# Patient Record
Sex: Female | Born: 1975 | Race: White | Hispanic: No | Marital: Married | Smoking: Never smoker
Health system: Southern US, Community
[De-identification: ages and names within clinical notes are randomized; demographics above are authoritative.]

## PROBLEM LIST (undated history)

## (undated) DIAGNOSIS — G43909 Migraine, unspecified, not intractable, without status migrainosus: Secondary | ICD-10-CM

## (undated) DIAGNOSIS — D649 Anemia, unspecified: Secondary | ICD-10-CM

## (undated) DIAGNOSIS — F419 Anxiety disorder, unspecified: Secondary | ICD-10-CM

## (undated) DIAGNOSIS — F329 Major depressive disorder, single episode, unspecified: Secondary | ICD-10-CM

## (undated) DIAGNOSIS — E785 Hyperlipidemia, unspecified: Secondary | ICD-10-CM

## (undated) DIAGNOSIS — F32A Depression, unspecified: Secondary | ICD-10-CM

## (undated) HISTORY — DX: Migraine, unspecified, not intractable, without status migrainosus: G43.909

## (undated) HISTORY — DX: Major depressive disorder, single episode, unspecified: F32.9

## (undated) HISTORY — DX: Anxiety disorder, unspecified: F41.9

## (undated) HISTORY — DX: Hyperlipidemia, unspecified: E78.5

## (undated) HISTORY — DX: Depression, unspecified: F32.A

## (undated) HISTORY — DX: Anemia, unspecified: D64.9

---

## 1998-06-19 HISTORY — PX: HEMANGIOMA EXCISION: SHX1734

## 1998-06-19 HISTORY — PX: BREAST SURGERY: SHX581

## 2000-12-25 ENCOUNTER — Other Ambulatory Visit: Admission: RE | Admit: 2000-12-25 | Discharge: 2000-12-25 | Payer: Self-pay | Admitting: *Deleted

## 2001-03-11 ENCOUNTER — Inpatient Hospital Stay (HOSPITAL_COMMUNITY): Admission: AD | Admit: 2001-03-11 | Discharge: 2001-03-11 | Payer: Self-pay | Admitting: General Surgery

## 2001-07-09 ENCOUNTER — Inpatient Hospital Stay (HOSPITAL_COMMUNITY): Admission: AD | Admit: 2001-07-09 | Discharge: 2001-07-13 | Payer: Self-pay | Admitting: *Deleted

## 2001-07-14 ENCOUNTER — Encounter: Admission: RE | Admit: 2001-07-14 | Discharge: 2001-08-13 | Payer: Self-pay | Admitting: *Deleted

## 2001-08-14 ENCOUNTER — Encounter: Admission: RE | Admit: 2001-08-14 | Discharge: 2001-09-13 | Payer: Self-pay | Admitting: *Deleted

## 2001-08-21 ENCOUNTER — Other Ambulatory Visit: Admission: RE | Admit: 2001-08-21 | Discharge: 2001-08-21 | Payer: Self-pay | Admitting: *Deleted

## 2007-05-01 ENCOUNTER — Emergency Department (HOSPITAL_COMMUNITY): Admission: EM | Admit: 2007-05-01 | Discharge: 2007-05-01 | Payer: Self-pay | Admitting: Emergency Medicine

## 2008-06-19 HISTORY — PX: ANKLE FRACTURE SURGERY: SHX122

## 2008-11-14 ENCOUNTER — Emergency Department (HOSPITAL_BASED_OUTPATIENT_CLINIC_OR_DEPARTMENT_OTHER): Admission: EM | Admit: 2008-11-14 | Discharge: 2008-11-14 | Payer: Self-pay | Admitting: Emergency Medicine

## 2008-11-14 ENCOUNTER — Ambulatory Visit: Payer: Self-pay | Admitting: Diagnostic Radiology

## 2008-11-25 ENCOUNTER — Ambulatory Visit (HOSPITAL_COMMUNITY): Admission: RE | Admit: 2008-11-25 | Discharge: 2008-11-27 | Payer: Self-pay | Admitting: Specialist

## 2009-01-06 ENCOUNTER — Encounter: Admission: RE | Admit: 2009-01-06 | Discharge: 2009-04-01 | Payer: Self-pay | Admitting: Specialist

## 2009-03-03 ENCOUNTER — Encounter: Admission: RE | Admit: 2009-03-03 | Discharge: 2009-03-03 | Payer: Self-pay | Admitting: Obstetrics and Gynecology

## 2009-05-24 ENCOUNTER — Ambulatory Visit: Payer: Self-pay | Admitting: Occupational Medicine

## 2009-05-24 DIAGNOSIS — F329 Major depressive disorder, single episode, unspecified: Secondary | ICD-10-CM

## 2009-07-29 ENCOUNTER — Ambulatory Visit: Payer: Self-pay | Admitting: Family Medicine

## 2010-06-19 HISTORY — PX: GYNECOLOGIC CRYOSURGERY: SHX857

## 2010-07-03 IMAGING — CR DG ANKLE PORT 2V*L*
2 series · 2 of 2 positions shown · non-contrast
Comparison: 11/25/2008

CLINICAL DATA: Distal fibular fracture, postop.

PORTABLE LEFT ANKLE - 2 VIEW

[view not recorded (1 of 2)]
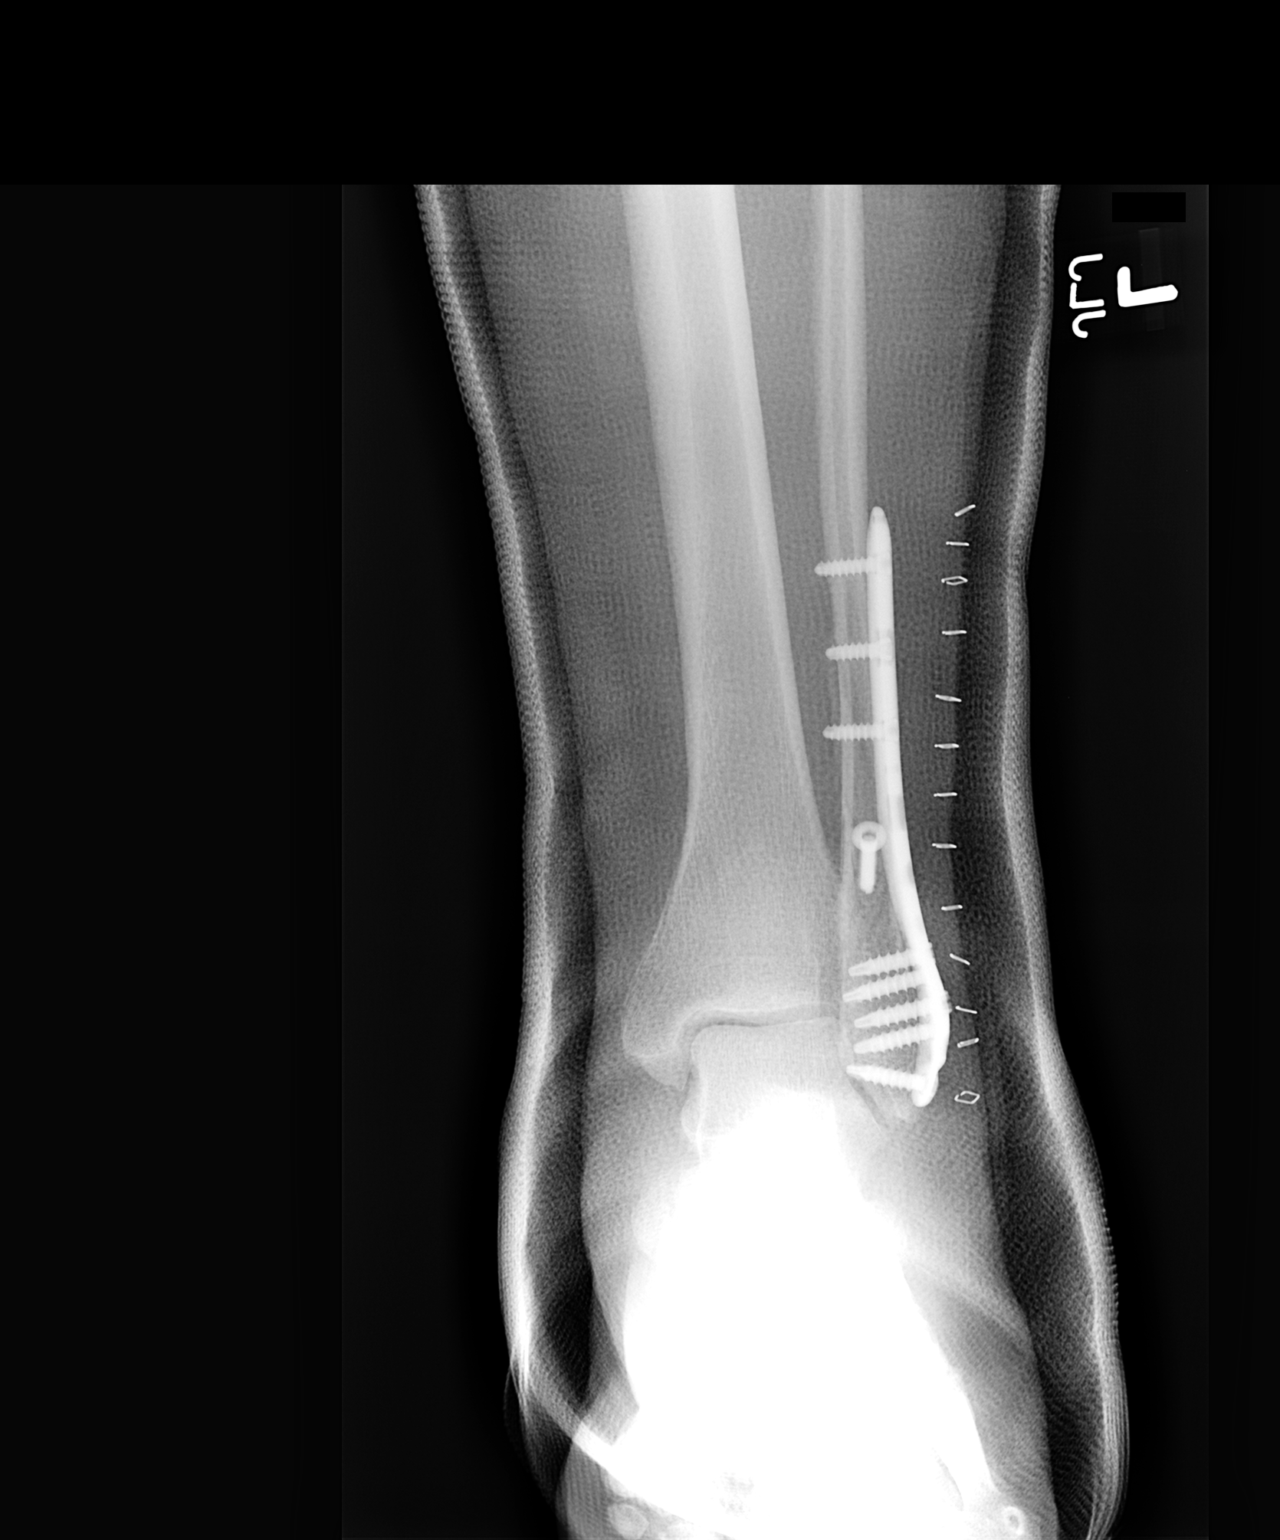

[view not recorded (2 of 2)]
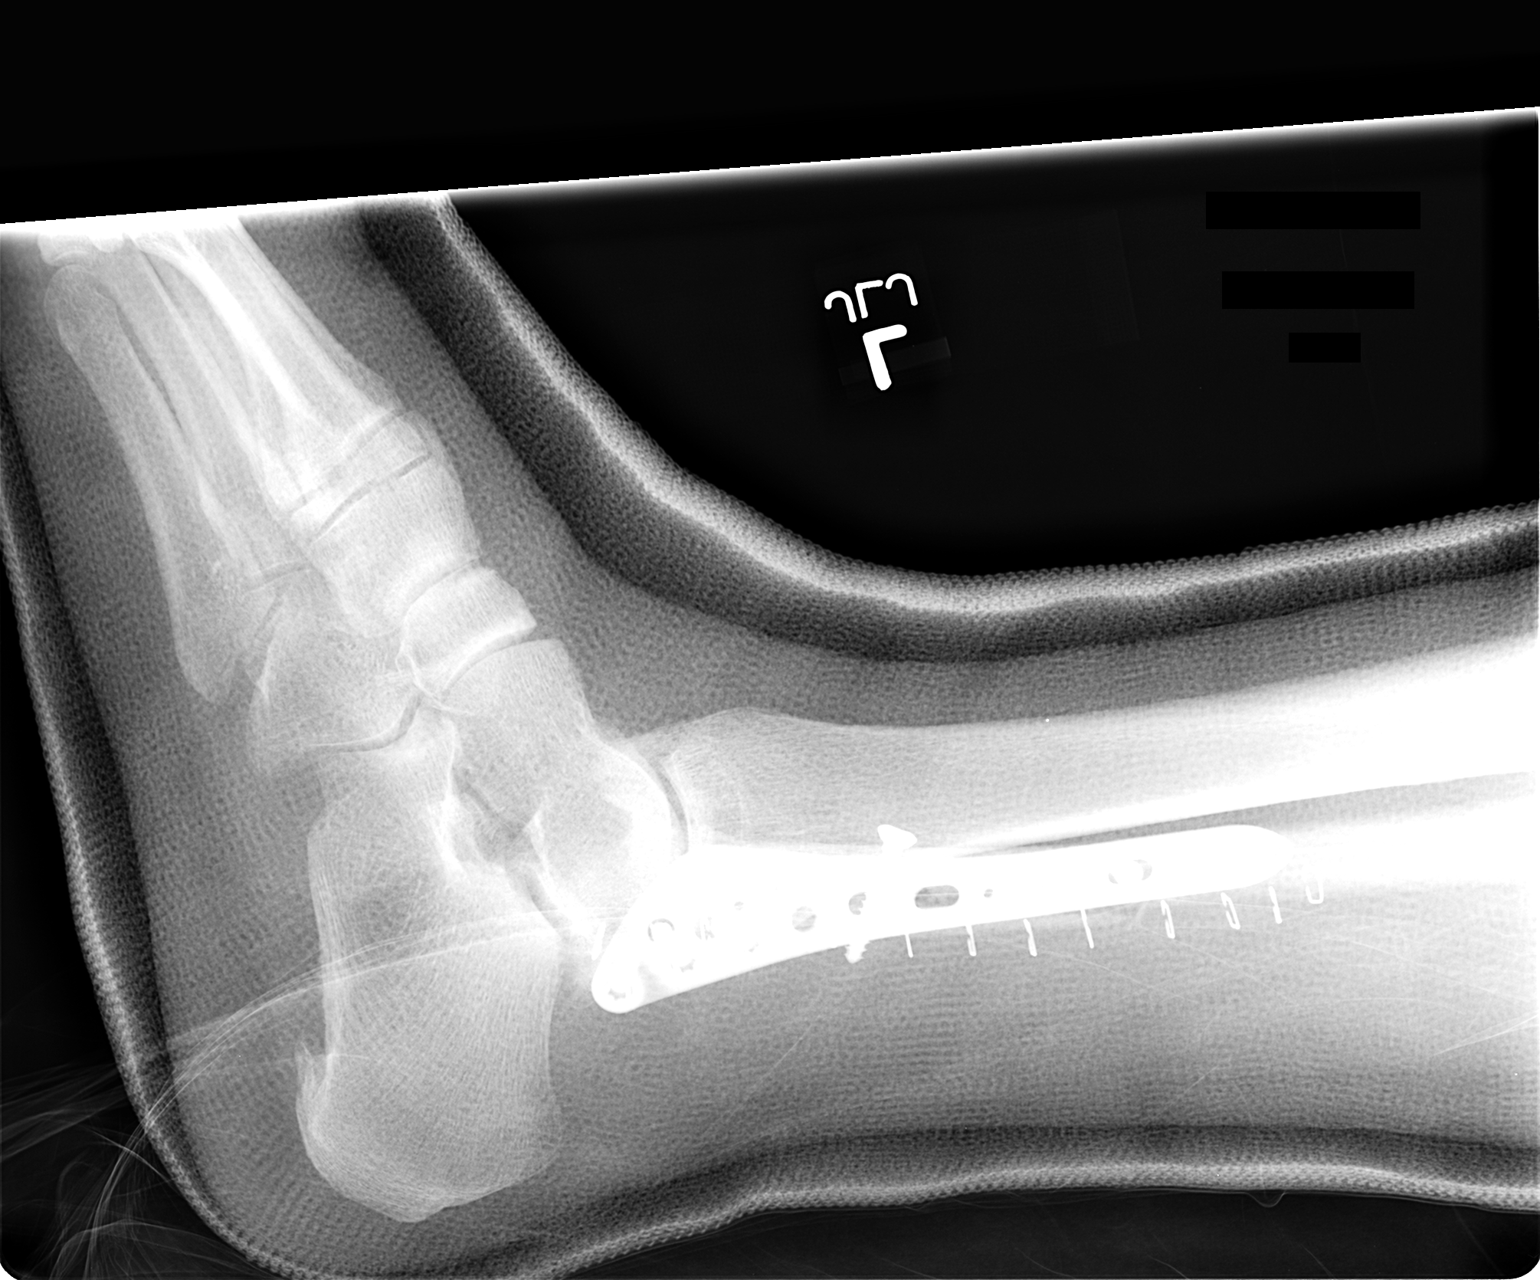

[2 of 2 positions shown; findings below may reference images not displayed]

FINDINGS: Plate screw fixation of the distal fibular fracture
noted.  There is anatomic alignment.  No tibial abnormality.
IMPRESSION: ORIF left fibular fracture with anatomic alignment.  No
complicating features.

## 2010-07-19 NOTE — Letter (Signed)
Summary: Out of Work  [facility]  [address]   [place], [place] [address]   Phone: [phone]  Fax: [phone]    July 29, 2009   Employee:  Debbie Schroeder    To Whom It May Concern:   For Medical reasons, please excuse the above named employee from work for the following dates:  Start:   29 Jul 2009  End:   01 Aug 2009 IF NO FEVER AND SYMPTOMS RESOLVING  If you need additional information, please feel free to contact our office.         Sincerely,    Marvis Moeller DO

## 2010-07-19 NOTE — Assessment & Plan Note (Signed)
Summary: SORE THROAT/WB   Vital Signs:  Patient Profile:   35 Years Old Female CC:      Chills, sore throat, body aches Height:     62.25 inches Weight:      167 pounds O2 Sat:      99 % O2 treatment:    Room Air Temp:     98.8 degrees F oral Pulse rate:   106 / minute Pulse rhythm:   regular Resp:     14 per minute BP sitting:   126 / 78  (right arm) Cuff size:   regular  Pt. in pain?   no  Vitals Entered By: Lita Mains, RN                   Updated Prior Medication List: ALPRAZOLAM 0.25 MG TABS (ALPRAZOLAM) 1-2 tabs as needed LEXAPRO 10 MG TABS (ESCITALOPRAM OXALATE) 1 tab by mouth once daily SUDAFED 30 MG TABS (PSEUDOEPHEDRINE HCL) as needed, one taken this AM  Current Allergies (reviewed today): ! * LATEX   History of Present Illness Chief Complaint: Chills, sore throat, body aches History of Present Illness: ONESET 2 DAYS AGO WITH BODYACHES. DEVELOPED SEVER SORE THROAT YESTERDAY. FEVER LOW GRADE. TAKING MOTRIN. NO RUNNY NOSE , NO COUGH. NO VOMITING OR DIARRHEA. HX OF STREP IN PAST.   REVIEW OF SYSTEMS Constitutional Symptoms       Complains of chills and fatigue.     Denies fever, night sweats, weight loss, and weight gain.  Eyes       Denies change in vision, eye pain, eye discharge, glasses, contact lenses, and eye surgery. Ear/Nose/Throat/Mouth       Complains of sore throat.      Denies hearing loss/aids, change in hearing, ear pain, ear discharge, dizziness, frequent runny nose, frequent nose bleeds, sinus problems, hoarseness, and tooth pain or bleeding.  Respiratory       Denies dry cough, productive cough, wheezing, shortness of breath, asthma, bronchitis, and emphysema/COPD.  Cardiovascular       Denies murmurs, chest pain, and tires easily with exhertion.    Gastrointestinal       Denies stomach pain, nausea/vomiting, diarrhea, constipation, blood in bowel movements, and indigestion. Genitourniary       Denies painful urination, kidney stones,  and loss of urinary control. Neurological       Complains of headaches.      Denies paralysis, seizures, and fainting/blackouts. Musculoskeletal       Complains of muscle pain.      Denies joint pain, joint stiffness, decreased range of motion, redness, swelling, muscle weakness, and gout.      Comments: body aches Skin       Denies bruising, unusual mles/lumps or sores, and hair/skin or nail changes.  Psych       Denies mood changes, temper/anger issues, anxiety/stress, speech problems, depression, and sleep problems.  Past History:  Past Medical History: Depression  Past Surgical History: Reviewed history from 05/24/2009 and no changes required. Caesarean section Ankle Surgery  Social History: Reviewed history from 05/24/2009 and no changes required. Married Never Smoked Alcohol use-yes 1 drink weekly Drug use-no Regular exercise-yes Assessment New Problems: PHARYNGITIS, ACUTE (ICD-462.0)   Plan New Medications/Changes: TYLENOL #3 WITH CODINE 1-2 by mouth Q 6 HRS as needed  #12 x 0, 07/29/2009, Kimberly Lykins DO AMOXICILLIN 875 MG TABS (AMOXICILLIN) 1 by mouth TID  ##30 x 0, 07/29/2009, Marvis Moeller DO  New Orders: Est. Patient Level III [phone]  Prescriptions: TYLENOL #3 WITH CODINE 1-2 by mouth Q 6 HRS as needed  #12 x 0   Entered and Authorized by:   Marvis Moeller DO   Signed by:   Marvis Moeller DO on 07/29/2009   Method used:   Print then Give to Patient   RxID:   [id number] AMOXICILLIN 875 MG TABS (AMOXICILLIN) 1 by mouth TID  ##30 x 0   Entered and Authorized by:   Marvis Moeller DO   Signed by:   Marvis Moeller DO on 07/29/2009   Method used:   Print then Give to Patient   RxID:   [id number]   Patient Instructions: 1)  TYLENOL OR MOTRIN AS NEEDED. AVOID CAFFEINE AND MILK PRODUCTS. THROAT SPRAY OF CHOICE. FOLLOW UP WITH YOUR PCP OR RETURN IF SYMPTOMS PESIST OR WORSEN.   Medication Administration  Injection # 1:    Medication:  Bicillin CR 1.2 million units Injection    Diagnosis: PHARYNGITIS, ACUTE (ICD-462.0)    Route: IM    Site: LUOQ gluteus    Exp Date: 02/17/2012    Lot #: [id number]    Mfr: Brooke Dare    Patient tolerated injection without complications    Given by: Lita Mains RN (July 29, 2009 1:19 PM)  Orders Added: 1)  Est. Patient Level III [09323]   Received call from Mike/Pharmacist at [facility] @ [phone] requesting verification of directions of antibitic Amoxicillin 875 mg 1 tab by mouth three times a day. Pharmacist stated that usual instructions has pt taking med two times a day due to the high dosage. Reviewed with Dr. Cathren Harsh, advsd pharmacist to change instruction from three times a day to two times a day . Areta Haber CMA  July 30, 2009 6:49 PM

## 2010-09-26 LAB — COMPREHENSIVE METABOLIC PANEL WITH GFR
ALT: 27 U/L (ref 0–35)
AST: 24 U/L (ref 0–37)
Albumin: 3.9 g/dL (ref 3.5–5.2)
Alkaline Phosphatase: 56 U/L (ref 39–117)
BUN: 17 mg/dL (ref 6–23)
CO2: 25 meq/L (ref 19–32)
Calcium: 9.8 mg/dL (ref 8.4–10.5)
Chloride: 103 meq/L (ref 96–112)
Creatinine, Ser: 0.83 mg/dL (ref 0.4–1.2)
GFR calc non Af Amer: >60 mL/min
Glucose, Bld: 97 mg/dL (ref 70–99)
Potassium: 3.9 meq/L (ref 3.5–5.1)
Sodium: 138 meq/L (ref 135–145)
Total Bilirubin: 0.7 mg/dL (ref 0.3–1.2)
Total Protein: 7.5 g/dL (ref 6.0–8.3)

## 2010-09-26 LAB — CBC
Hemoglobin: 12.7 g/dL (ref 12.0–15.0)
MCHC: 34.8 g/dL (ref 30.0–36.0)
MCV: 90.7 fL (ref 78.0–100.0)
Platelets: 237 10*3/uL (ref 150–400)
Platelets: 330 10*3/uL (ref 150–400)
RDW: 11.9 % (ref 11.5–15.5)
RDW: 12.2 % (ref 11.5–15.5)
WBC: 8.9 10*3/uL (ref 4.0–10.5)

## 2010-09-26 LAB — PLATELET FUNCTION ASSAY
Collagen / ADP: 86 s (ref 0–113)
Collagen / Epinephrine: 240 s (ref 0–180)

## 2010-10-09 IMAGING — MG MM DIGITAL DIAGNOSTIC BILAT CAD
6 series · 6 of 6 positions shown · non-contrast
Comparison: None, baseline

CLINICAL DATA: Palpable abnormality left breast.

DIGITAL DIAGNOSTIC  BILATERAL  MAMMOGRAM  CAD AND LEFT BREAST
ULTRASOUND:

[R CC (1 of 2)]
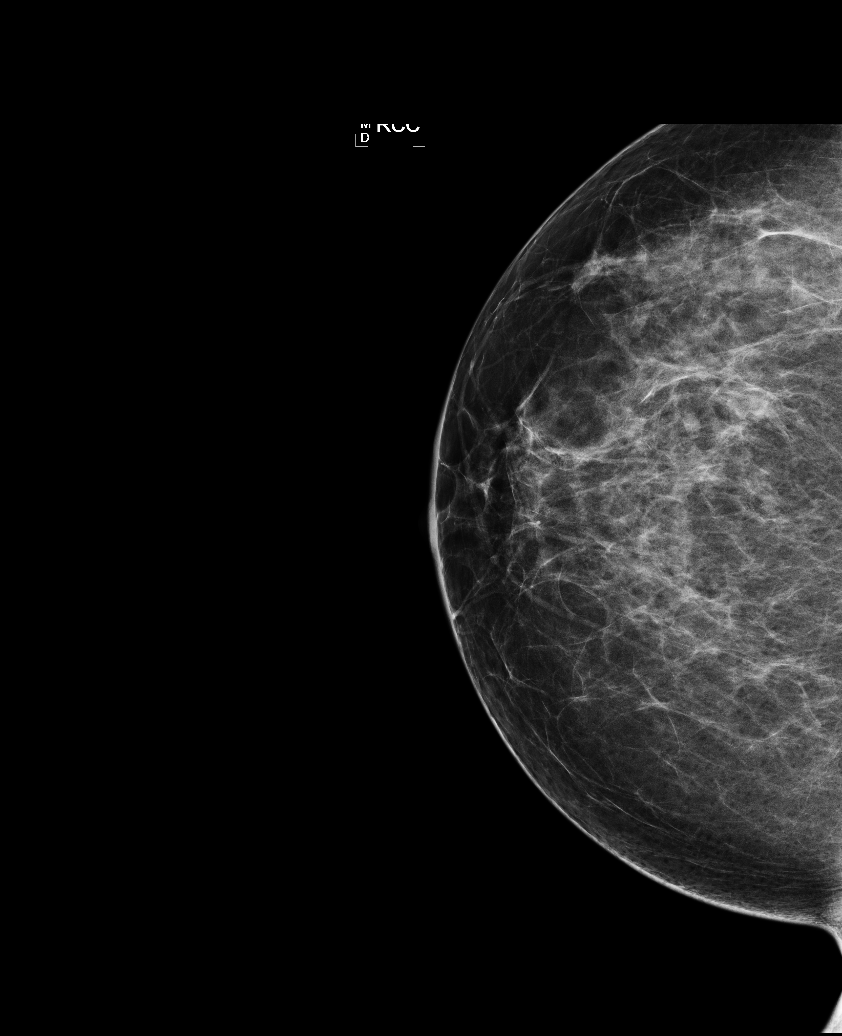

[L CC]
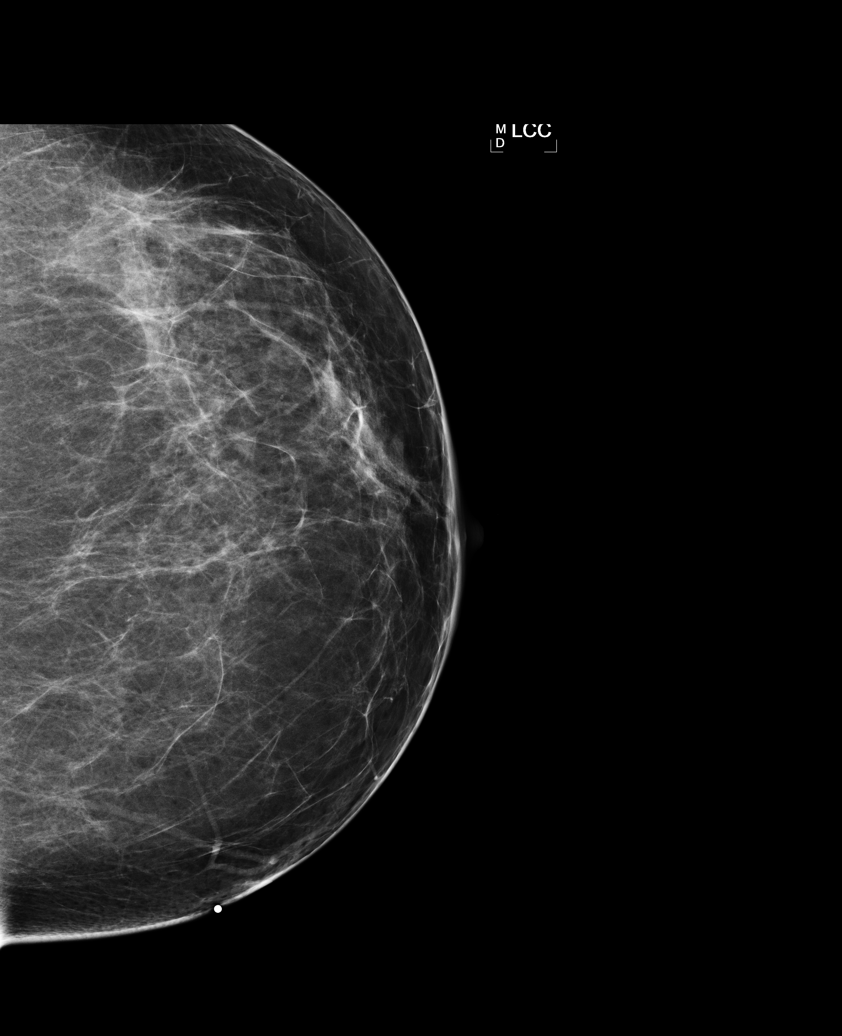

[L MLO]
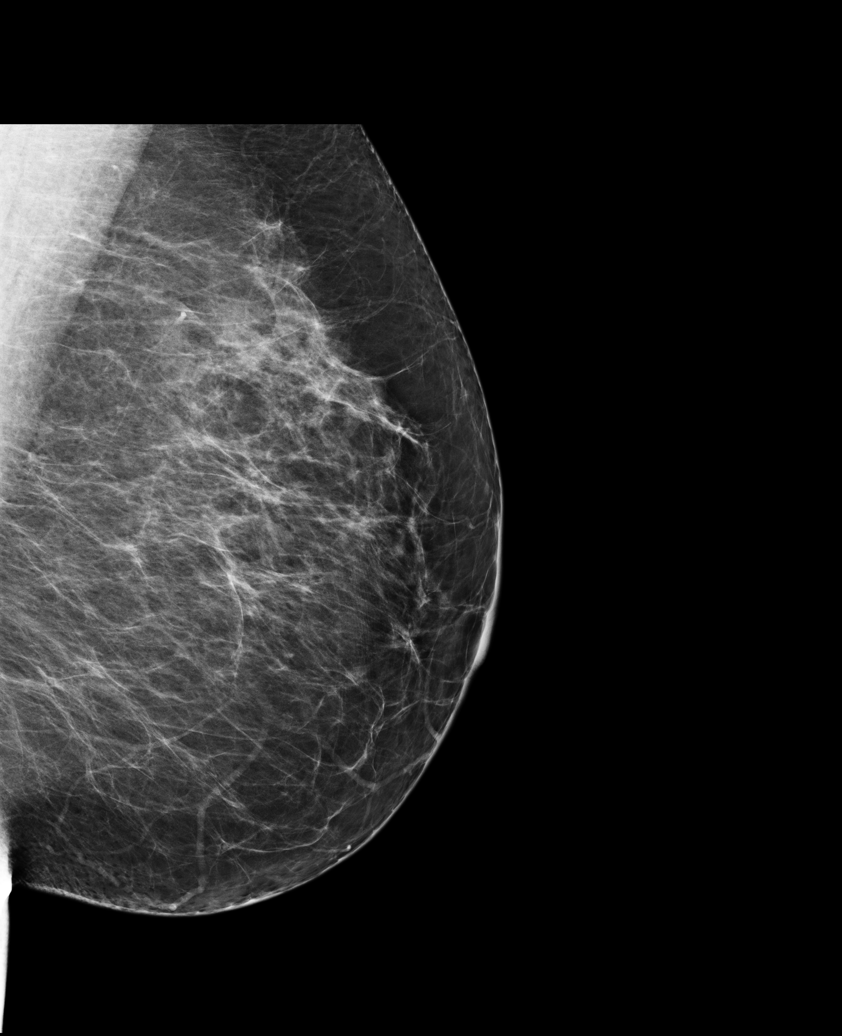

[R MLO]
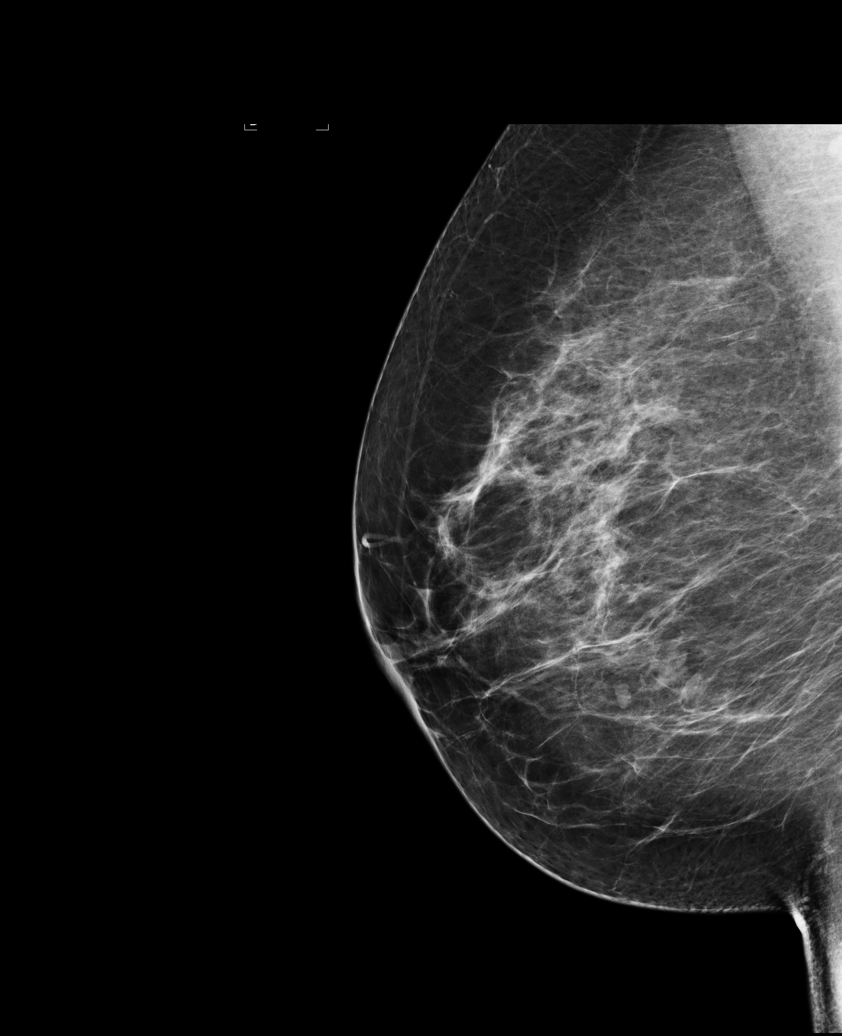

[L TAN]
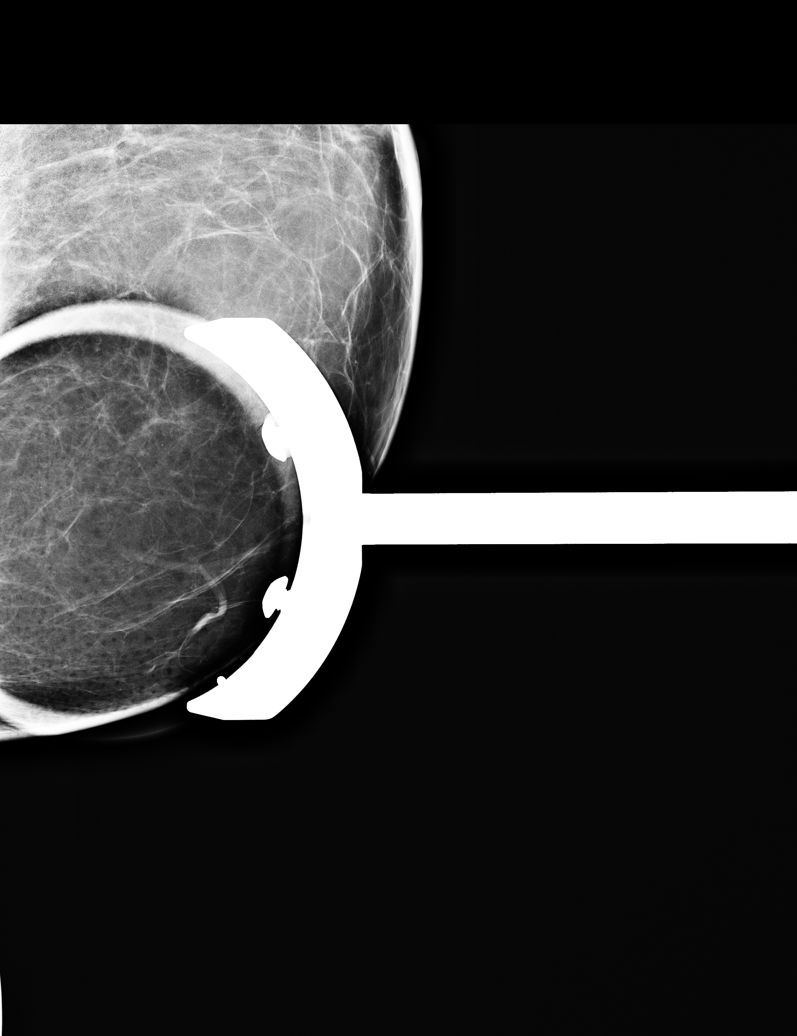

[R CC (2 of 2)]
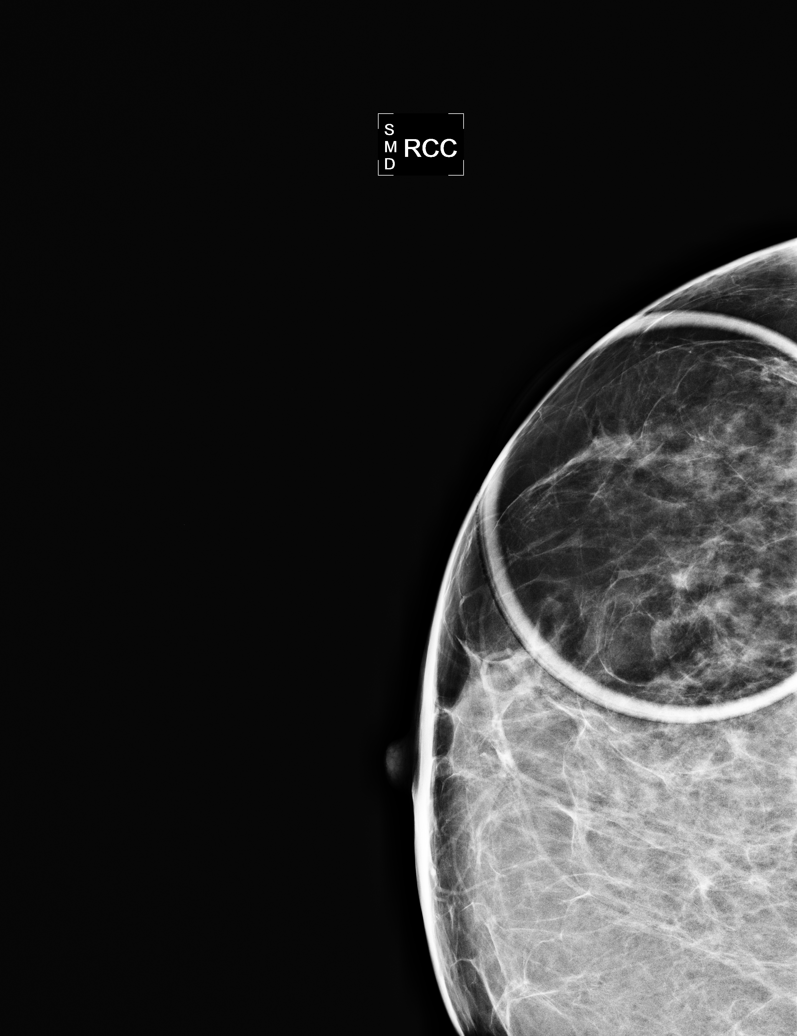

[6 of 6 positions shown; findings below may reference images not displayed]

FINDINGS: There are scattered fibroglandular densities. No
suspicious mass, distortion, or microcalcifications are identified
to suggest presence of malignancy. Spot tangential view in the area
of concern in the medial portion of the left breast is
unremarkable.
Mammographic images were processed with CAD.

On physical exam, I do palpate soft nodule in the 8 o'clock
position of the left breast 10 cm from the nipple.

Ultrasound is performed, showing normal-appearing fibrofatty tissue
in the area of concern.  There is suggestion of a capsule
surrounding fibrofatty tissue to indicate possible lipoma
accounting for the palpable abnormality.  Alternatively, the
findings could be related to fat lobule or area of fat necrosis. No
mass, distortion, or acoustic shadowing is demonstrated with
ultrasound.
IMPRESSION: No mammographic or ultrasound evidence for malignancy.  The
palpable abnormality appears to represent a probable lipoma.
Screening mammogram is recommended in one year.

BI-RADS CATEGORY 2:  Benign finding(s).

## 2010-11-01 NOTE — Op Note (Signed)
NAMENHUNG, DANKO                ACCOUNT NO.:  [id number]   MEDICAL RECORD NO.:  [id number]          PATIENT TYPE:  OIB   LOCATION:  1609                         FACILITY:  [facility]   PHYSICIAN:  Jene Every, M.D.    DATE OF BIRTH:  June 13, 1976   DATE OF PROCEDURE:  11/25/2008  DATE OF DISCHARGE:                               OPERATIVE REPORT   PREOPERATIVE DIAGNOSIS:  Bimalleolar ankle fracture.   POSTOPERATIVE DIAGNOSIS:  Bimalleolar ankle fracture.   PROCEDURE PERFORMED:  1. Open reduction/internal fixation of bimalleolar ankle fracture.  2. Stress radiographs under anesthesia.   ANESTHESIA:  General.   SURGEON:  Jene Every, M.D.   ASSISTANT:  Roma Schanz, P.A.   BRIEF HISTORY:  A 35 year old sustained an ankle fracture, comminuted.  Initially felt just to be a comminuted fibula with possible syndesmotic  widening.  On subsequent further evaluation revealed a posterior  malleolar fracture as well as a proximal 25% of the surface with slight  stepoff.  She was indicated for open reduction/internal fixation,  possible syndesmosis screw.  The risks and benefits have been discussed,  including bleeding, infection, damage to vascular structures, suboptimal  range of motion, __________, DVT, PE, and extended complications, etc.  A health care worker exposed her to MRSA, and she was given vancomycin  preoperatively.   TECHNIQUE:  With the patient in the supine position after the induction  of adequate general anesthesia and 1 gm of vancomycin, the left lower  extremity was prepped and draped an exsanguinated in the usual sterile  fashion.  A thigh tourniquet inflated at 300 mmHg.  A midline incision  was made over the fibula.  Subcutaneous tissue was dissected.  Electrocautery was utilized to achieve hemostasis.  Full-thickness flaps  were developed.  Care was taken to preserve the peroneal nerve and its  branches.  We identified the fracture site.  There was  comminution  noted.  We curetted the fracture hematoma, reduced, and held the  fracture with a tenaculum.  We put a lag screw with an overdrill 3.5, a  full drill of a 2.5, and inserted appropriate length screw.  Suboptimal  purchase was obtained secondary to the comminution.  Therefore, decided  to use a Synthes precontoured titanium plate to capture the distal  fragment optimally.  This has been selected with a small __________  applied to the fibula, clamped and held proximally with fully-threaded  cortical screws after the appropriate drilling depth gave measurement  insertion of the screws and additionally multiple locking screws  utilizing the drill guides.  Multiple 16 mm lag locking screws were  placed and then torqued to the appropriate level.  They attained  excellent purchase distally and proximally.  Under the AP and lateral  plane, there is no widening syndesmosis, anatomic reduction of the  fracture.  There was no attempt with manipulation with stress  radiographs.  There is no motion at the syndesmosis and the fracture  site.  The fracture was reduced, as was the medial clear space.  No need  to open up medially.  With the leg in the  neutral as well as the plantar-  flexed position of 30 degrees, the posterior malleolar fragment was  reduced.  This was radiographed outside of the cast.  The wound was  copiously irrigated.  Next, I closed the periosteum and subcu with 0 and  2-0 Vicryl simple sutures.  The skin was reapproximated with staples.  The wound was dressed sterilely.  The tourniquet was deflated, and there  was adequate vascularization of the lower extremity appreciated.  Patient was placed in the neutral cast, slightly supinated fiberglass  cast, well padded, cold water, appropriate drain.   Next, the patient was extubated without difficulty and transported to  the recovery room in satisfactory condition.  Patient tolerated the  procedure well with no  complications.      Jene Every, M.D.  Electronically Signed     JB/MEDQ  D:  11/25/2008  T:  11/25/2008  Job:  [id number]

## 2010-11-04 NOTE — Op Note (Signed)
[facility] of [facility]  Patient:    Debbie Schroeder, Debbie Schroeder Visit Number: [id number] MRN: [id number]          Service Type: OBS Location: 910A 9135 01 Attending Physician:  Ermalene Searing Dictated by:   Marina Gravel, M.D. Proc. Date: 07/10/01 Admit Date:  07/09/2001                             Operative Report  PREOPERATIVE DIAGNOSIS:       Failure to descend.  POSTOPERATIVE DIAGNOSIS:      Failure to descend.  OPERATION:                    Primary low transverse cesarean section.  SURGEON:                      Marina Gravel, M.D.  ASSISTANT:                    Nurse.  ANESTHESIA:                   Epidural.  FINDINGS:                     Viable female at 8 pounds 15 ounces.  Apgars 9 and 9.  Normal uterus, tubes, and ovaries.  ESTIMATED BLOOD LOSS:         750 cc.  COMPLICATIONS:                None.  INDICATIONS:                  Patient pushed for over two hours and did not descend beyond +2-+3 station.  She had a moderate amount of caput.  Options were discussed including continued pushing versus operative vaginal delivery versus primary cesarean section.  After discussing the risks of benefits of each patient desired to proceed with primary cesarean section.  DESCRIPTION OF PROCEDURE:     Patient was taken to the operating room with epidural anesthesia in place.  She was prepped and draped in the standard fashion.  The bladder was already being emptied with the Foley catheter. Pfannenstiel incision was made with the knife and carried sharply to the underlying fascia.  The fascia was divided in the midline and extended laterally with Mayo scissors.  Kocher clamps used to elevate the superior aspect of the incision.  The underlying rectus muscles were dissected off sharply.  Repeated inferiorly in a similar fashion.  The midline of the rectus muscles was identified at the superior aspect and the peritoneum entered sharply.  Extended inferiorly  sharply.  Bladder blade was inserted and vesicouterine peritoneum identified and elevated and bladder flap created with sharp and blunt technique.  The uterine incision was subsequently made in a low transverse fashion.  This was extended laterally with bandage scissors.  A nuchal cord x1 was noted at entry into the uterine cavity.  The assistants hand was used to elevate the head from the vaginal side and then the head was easily delivered through the incision.  Nose and mouth suctioned with the bulb and the remainder of the infant delivered without difficulty.  The cord was clamped and cut and the infant handed off to the waiting pediatrician.  Cefotan 2 g was given at cord clamp.  The placenta was removed manually and the uterus exteriorized and cleared of all clots and debris.  The uterine incision was inspected and was free of extension.  It was subsequently closed in two layers with 0 Monocryl. Hemostasis obtained.  The pelvis was inspected with the above findings noted.  Uterus returned to the abdomen and pelvis irrigated.  Uterine incision was hemostatic as was the bladder flap.  Subfascial space was hemostatic as well.  The fascia was closed in a running stitch of 0 Vicryl.  Subcutaneous tissue was irrigated and made hemostatic with the Bovie.  Skin was closed with staples.  The patient tolerated procedure well.  There were no complications.  She was taken to the recovery room awake, alert, in stable condition.  All counts correct per operating room staff. Dictated by:   Marina Gravel, M.D. Attending Physician:  Marina Gravel B DD:  07/10/01 TD:  07/11/01 Job: [id number]

## 2010-11-04 NOTE — H&P (Signed)
[facility] of [facility]  Patient:    Debbie Schroeder, Debbie Schroeder Visit Number: [id number] MRN: [id number]          Service Type: Attending:  Marina Gravel, M.D. Dictated by:   Marina Gravel, M.D. Adm. Date:  07/09/01                           History and Physical  DATE OF BIRTH:                [birth year]  SS#:                          [id number]  ADMISSION DIAGNOSES:          1. Term intrauterine pregnancy.                               2. Severe sciatica.                               3. Large for gestational age fetus.  PLANNED PROCEDURE:            Induction of labor.  HISTORY OF PRESENT ILLNESS:   This is a 35 year old white female, at 38+ weeks, who presents with severe left lower extremity pain which is limiting her ability to bear weight, walk, and work.  She also is noted to have an LGA fetus, estimated fetal weight 3744 g, with associated AFI of 18.2.  PRENATAL CARE:                [facility], Dr. Earlene Plater, complicated only by Rh-negative and history of first trimester bleeding, and was given RhoGAM appropriately.  PAST MEDICAL HISTORY:         1. Hematuria.                               2. IBS.                               3. Migraines.  PAST SURGICAL HISTORY:        None.  FAMILY HISTORY:               Renal disease, lymphoma, and schizophrenia. Multiple members with brain aneurysm at the second and third degree relative stage.  MEDICATIONS:                  Prenatal vitamins.  ALLERGIES:                    None.  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  No alcohol, tobacco, or other drugs.  Her husband is supportive.  PRENATAL LABORATORY DATA:     O-negative.  Rubella immune.  GC, Chlamydia, HIV, hepatitis B, RPR all negative.  Glucola 136.  Group B strep negative.  PHYSICAL EXAMINATION:  VITAL SIGNS:                  Weight 191 pounds.  Blood pressure 120/76. Fetal  heart tones 130s.  GENERAL:  The patient is alert and oriented, in no acute distress.  SKIN:                         Warm and dry.  No lesions.  NECK:                         Supple without thyromegaly.  HEART:                        Regular rate and rhythm.  LUNGS:                        Clear to auscultation.  ABDOMEN:                      Fundal height 40 cm.  PELVIC:                       Cervix 1-2 cm dilated, 50% effaced, -1 station, vertex presentation.  ASSESSMENT:                   Term intrauterine pregnancy with severe left-sided sciatica, with favorable cervix.  Also noted large for gestational age on ultrasound on July 08, 2001.  PLAN:                         Admission for induction of labor with artificial rupture of membranes and Pitocin. Dictated by:   Marina Gravel, M.D. Attending:  Marina Gravel, M.D. DD:  07/08/01 TD:  07/09/01 Job: [id number]

## 2010-11-04 NOTE — Discharge Summary (Signed)
[facility] of [facility]  Patient:    Debbie Schroeder, Debbie Schroeder Visit Number: [id number] MRN: [id number]          Service Type: OBS Location: 910A 9135 01 Attending Physician:  Ermalene Searing Dictated by:   Marina Gravel, M.D. Admit Date:  07/09/2001 Discharge Date: 07/13/2001                             Discharge Summary  ADMISSION DIAGNOSES:          1. Term intrauterine pregnancy.                               2. Suspect large for gestational age fetus.                               3. Severe sciatic pain.  DISCHARGE DIAGNOSES:          1. Term intrauterine pregnancy.                               2. Suspect large for gestational age fetus.                               3. Severe sciatic pain.  PROCEDURES:                   1. Admission for induction of labor.                               2. Cesarean section for failure to descend.  HISTORY OF PRESENT ILLNESS:   For complete details, please see the history and physical in the chart.  Briefly, the patient is a 35 year old white female, gravida 1, para 0, who presents at 38+ weeks for induction of labor.  The patient with severe sciatic pain which has limited her ability to bear weight on her right lower extremity.  Also with suspected LGA fetus, estimated fetal weight on ultrasound the day prior to admission 3744 g.  HOSPITAL COURSE:              The patient was admitted to labor and delivery and started with Pitocin and artificial rupture of membranes.  The patient subsequently progressed well until approximately 7-8 cm when she began to have a delayed active phase.  This required increasing of Pitocin with monitoring by intrauterine compression catheter.  The patient subsequently developed fever in labor and was given ampicillin and gentamicin.  The patient ultimately progressed to complete/complete/+2 and pushed for over two hours with no further descent.  The options were discussed, and the patient ultimately  decided to proceed with a cesarean section.  The patient was delivered by low transverse cesarean section.  A viable female was delivered on July 10, 2001.  He was an 8-pound 15-ounce female, Apgars of 9 and 9, with normal-appearing uterus, tubes, and ovaries.  There were no complications, and the patient tolerated the procedure well.  Postoperatively the patient was maintained on ampicillin and gentamicin for 24 hours and remained afebrile, and antibiotics were subsequently discontinued. She rapidly regained her ability to ambulate and void and tolerate a regular diet and was, therefore, discharged to  home on the third postoperative day in satisfactory condition.  The patient was noted to be O negative and rubella immune.  She did receive RhoGAM.  Her postoperative hemoglobin was 7.1, without symptoms of anemia or orthostasis.  DISCHARGE MEDICATIONS:        1. Hemocyte Plus 1 p.o. b.i.d. for anemia.                               2. Percocet 5/325 1-2 p.o. q.4-6h. as needed for                                  pain.  DISCHARGE INSTRUCTIONS:       Standard prepared instructions were given prior to dismissal.  FOLLOW-UP:                    On July 15, 2001, in the office for staple removal and in six weeks for postpartum checkup.  CONDITION ON DISCHARGE:       Satisfactory. Dictated by:   Marina Gravel, M.D. Attending Physician:  Marina Gravel B DD:  07/13/01 TD:  07/14/01 Job: [id number]

## 2013-04-23 ENCOUNTER — Telehealth: Payer: Self-pay | Admitting: Emergency Medicine

## 2013-04-23 DIAGNOSIS — R7309 Other abnormal glucose: Secondary | ICD-10-CM

## 2013-04-23 DIAGNOSIS — F411 Generalized anxiety disorder: Secondary | ICD-10-CM

## 2013-04-23 MED ORDER — METFORMIN HCL 500 MG PO TABS: 500.0000 mg | ORAL_TABLET | Freq: Two times a day (BID) | ORAL | Status: DC

## 2013-04-23 MED ORDER — ALPRAZOLAM 1 MG PO TABS: 1.0000 mg | ORAL_TABLET | Freq: Every evening | ORAL | Status: DC | PRN

## 2013-04-23 NOTE — Telephone Encounter (Signed)
Refill on metformin hcl 500mg  tab qty 60

## 2013-04-23 NOTE — Telephone Encounter (Signed)
Refill on alprazolam 1 mg tab qty 60

## 2013-04-24 ENCOUNTER — Encounter: Payer: Self-pay | Admitting: Internal Medicine

## 2013-04-25 ENCOUNTER — Encounter: Payer: Self-pay | Admitting: Emergency Medicine

## 2013-04-25 ENCOUNTER — Ambulatory Visit: Payer: BC Managed Care – PPO | Admitting: Emergency Medicine

## 2013-04-25 VITALS — BP 120/82 | HR 74 | Temp 97.8°F | Resp 18 | Ht 62.0 in | Wt 198.0 lb

## 2013-04-25 DIAGNOSIS — E669 Obesity, unspecified: Secondary | ICD-10-CM

## 2013-04-25 DIAGNOSIS — E7800 Pure hypercholesterolemia, unspecified: Secondary | ICD-10-CM | POA: Insufficient documentation

## 2013-04-25 DIAGNOSIS — R51 Headache: Secondary | ICD-10-CM

## 2013-04-25 LAB — BASIC METABOLIC PANEL WITH GFR
GFR, Est African American: >89 mL/min
GFR, Est Non African American: >89 mL/min
Glucose, Bld: 86 mg/dL (ref 70–99)
Potassium: 3.7 mEq/L (ref 3.5–5.3)
Sodium: 138 mEq/L (ref 135–145)

## 2013-04-25 MED ORDER — PHENTERMINE HCL 37.5 MG PO CAPS: 37.5000 mg | ORAL_CAPSULE | ORAL | Status: DC

## 2013-04-25 MED ORDER — CYCLOBENZAPRINE HCL 10 MG PO TABS: ORAL_TABLET | ORAL | Status: DC

## 2013-04-25 NOTE — Patient Instructions (Addendum)
Obesity Obesity is having too much body fat and a body mass index (BMI) of 30 or more. BMI is a number based on your height and weight. The number is an estimate of how much body fat you have. Obesity can happen if you eat more calories than you can burn by exercising or other activity. It can cause major health problems or emergencies.  HOME CARE  Exercise and be active as told by your doctor. Try:  Using stairs when you can.  Parking farther away from store doors.  Gardening, biking, or walking.  Eat healthy foods and drinks that are low in calories. Eat more fruits and vegetables.  Limit fast food, sweets, and snack foods that are made with ingredients that are not natural (processed food).  Eat smaller amounts of food.  Keep a journal and write down what you eat every day. Websites can help with this.  Avoid drinking alcohol. Drink more water and drinks without calories.   Take vitamins and dietary pills (supplements) only as told by your doctor.  Try going to weight-loss support groups or classes to help lessen stress. Dieticians and counselors may also help. GET HELP RIGHT AWAY IF:  You have chest pain or tightness.  You have trouble breathing or feel short of breath.  You feel weak or have loss of feeling (numbness) in your legs.  You feel confused or have trouble talking.  You have sudden changes in your vision. MAKE SURE YOU:  Understand these instructions.  Will watch your condition.  Will get help right away if you are not doing well or get worse. Document Released: 08/28/2011 Document Reviewed: 08/28/2011 [employer] Patient Information 2014 [employer].  

## 2013-04-25 NOTE — Progress Notes (Signed)
  Subjective:    Patient ID: Debbie Schroeder, female    DOB: May 29, 1976, 37 y.o.   MRN: [id number]  HPI Comments: 37 yo obese female presents for recheck with new start Phentermine she is down 11#. She has improved diet and exercise, D/C soda and started a life coach. She is still under increased stress over custody battle and notices more headaches with stress. Admits to sensitivity to light with nausea, headache usually in the a.m. when wakes up with sore / tight neck. Otherwise doing well pulse/ BP both staying WNL.  Headache      Current Outpatient Prescriptions on File Prior to Visit  Medication Sig Dispense Refill  . ALPRAZolam (XANAX) 1 MG tablet Take 1 tablet (1 mg total) by mouth at bedtime as needed for sleep.  30 tablet  0  . metFORMIN (GLUCOPHAGE) 500 MG tablet Take 1 tablet (500 mg total) by mouth 2 (two) times daily with a meal.  60 tablet  3   No current facility-administered medications on file prior to visit.   Allergies Latex and Viibryd  Past Medical History  Diagnosis Date  . Hyperlipidemia   . Anxiety   . Depression   . Anemia   . Migraine     Review of Systems  Constitutional: Negative.   Respiratory: Negative.   Cardiovascular: Negative.   Gastrointestinal: Negative.   Endocrine: Negative.   Genitourinary: Negative.   Musculoskeletal: Negative.   Neurological: Positive for headaches.  Psychiatric/Behavioral:       + Stress   BP 120/82  Pulse 74  Temp(Src) 97.8 F (36.6 C) (Temporal)  Resp 18  Ht 5\' 2"  (1.575 m)  Wt 198 lb (89.812 kg)  BMI 36.21 kg/m2  LMP 04/11/2013     Objective:   Physical Exam  Nursing note and vitals reviewed. Constitutional: She is oriented to person, place, and time. She appears well-developed and well-nourished.  Neck: Normal range of motion.  Cardiovascular: Normal rate, regular rhythm, normal heart sounds and intact distal pulses.   Pulmonary/Chest: Effort normal and breath sounds normal.  Musculoskeletal: Normal  range of motion.  Neurological: She is alert and oriented to person, place, and time.  Skin: Skin is warm and dry.  Psychiatric: She has a normal mood and affect. Judgment normal.          Assessment & Plan:  Obese on Phentermine continue AD with 1 RF, w/c if any CV symptoms, CHeck labs. Stress- continue life counselor, wt loss, better diet and exercise to help with headaches. Trial of Flexeril 10mg  1 qhs to see if neck tension resolves and decreases headache symptoms.

## 2013-04-28 ENCOUNTER — Telehealth: Payer: Self-pay | Admitting: *Deleted

## 2013-04-28 NOTE — Telephone Encounter (Signed)
Message copied by Nicholaus Corolla A on Mon Apr 28, 2013 10:16 AM ------      Message from: [place], [place] R      Created: Mon Apr 28, 2013  5:49 AM       WNL continue RX same. ------

## 2013-04-29 ENCOUNTER — Ambulatory Visit: Payer: Self-pay | Admitting: Emergency Medicine

## 2013-04-29 NOTE — Telephone Encounter (Signed)
Spoke with pt about recent lab results 

## 2013-05-23 ENCOUNTER — Other Ambulatory Visit: Payer: Self-pay

## 2013-05-23 ENCOUNTER — Other Ambulatory Visit: Payer: Self-pay | Admitting: Emergency Medicine

## 2013-05-23 DIAGNOSIS — Z79899 Other long term (current) drug therapy: Secondary | ICD-10-CM

## 2013-05-26 ENCOUNTER — Ambulatory Visit (INDEPENDENT_AMBULATORY_CARE_PROVIDER_SITE_OTHER): Payer: 59

## 2013-05-26 VITALS — BP 116/84 | HR 76 | Temp 98.4°F | Resp 16 | Wt 195.2 lb

## 2013-05-26 DIAGNOSIS — Z79899 Other long term (current) drug therapy: Secondary | ICD-10-CM

## 2013-05-26 LAB — HEPATIC FUNCTION PANEL
Alkaline Phosphatase: 57 U/L (ref 39–117)
Indirect Bilirubin: 0.3 mg/dL (ref 0.0–0.9)
Total Bilirubin: 0.4 mg/dL (ref 0.3–1.2)

## 2013-05-26 LAB — BASIC METABOLIC PANEL WITH GFR
Chloride: 104 mEq/L (ref 96–112)
GFR, Est Non African American: 85 mL/min
Potassium: 4 mEq/L (ref 3.5–5.3)

## 2013-05-26 NOTE — Progress Notes (Signed)
Pt here for weight check and to have BMET and HFP checked. Pt states Flexeril is helping tremendously with headaches.

## 2013-05-27 ENCOUNTER — Telehealth: Payer: Self-pay | Admitting: *Deleted

## 2013-05-27 DIAGNOSIS — E669 Obesity, unspecified: Secondary | ICD-10-CM

## 2013-05-27 MED ORDER — PHENTERMINE HCL 37.5 MG PO CAPS: 37.5000 mg | ORAL_CAPSULE | ORAL | Status: DC

## 2013-05-27 NOTE — Telephone Encounter (Signed)
Spoke with patient about lab results.  Patient requesting refill on Phentermine Rx.  Rx called into [facility].

## 2013-05-27 NOTE — Telephone Encounter (Signed)
Message copied by Nicholaus Corolla A on Tue May 27, 2013 12:58 PM ------      Message from: [place], [place] R      Created: Tue May 27, 2013  5:29 AM       ALL WNL conitnue RX same. ------

## 2013-06-17 ENCOUNTER — Other Ambulatory Visit: Payer: Self-pay | Admitting: Emergency Medicine

## 2013-06-17 DIAGNOSIS — F411 Generalized anxiety disorder: Secondary | ICD-10-CM

## 2013-06-17 MED ORDER — ALPRAZOLAM 1 MG PO TABS: 1.0000 mg | ORAL_TABLET | Freq: Two times a day (BID) | ORAL | Status: DC | PRN

## 2013-09-02 ENCOUNTER — Other Ambulatory Visit: Payer: Self-pay | Admitting: Emergency Medicine

## 2013-09-02 DIAGNOSIS — F411 Generalized anxiety disorder: Secondary | ICD-10-CM

## 2013-09-02 DIAGNOSIS — R7309 Other abnormal glucose: Secondary | ICD-10-CM

## 2013-09-02 MED ORDER — ALPRAZOLAM 1 MG PO TABS: 1.0000 mg | ORAL_TABLET | Freq: Two times a day (BID) | ORAL | Status: DC | PRN

## 2013-09-02 MED ORDER — METFORMIN HCL 500 MG PO TABS: 500.0000 mg | ORAL_TABLET | Freq: Two times a day (BID) | ORAL | Status: DC

## 2013-09-02 MED ORDER — CYCLOBENZAPRINE HCL 10 MG PO TABS: ORAL_TABLET | ORAL | Status: DC

## 2013-09-22 DIAGNOSIS — IMO0002 Reserved for concepts with insufficient information to code with codable children: Secondary | ICD-10-CM | POA: Insufficient documentation

## 2013-10-16 ENCOUNTER — Encounter: Payer: Self-pay | Admitting: Emergency Medicine

## 2013-11-05 ENCOUNTER — Encounter: Payer: Self-pay | Admitting: Emergency Medicine

## 2013-11-05 ENCOUNTER — Other Ambulatory Visit: Payer: Self-pay | Admitting: Emergency Medicine

## 2013-11-05 ENCOUNTER — Ambulatory Visit (INDEPENDENT_AMBULATORY_CARE_PROVIDER_SITE_OTHER): Payer: BC Managed Care – PPO | Admitting: Emergency Medicine

## 2013-11-05 VITALS — BP 120/82 | HR 82 | Temp 98.6°F | Resp 18 | Ht 63.0 in | Wt 189.0 lb

## 2013-11-05 DIAGNOSIS — Z79899 Other long term (current) drug therapy: Secondary | ICD-10-CM

## 2013-11-05 DIAGNOSIS — F411 Generalized anxiety disorder: Secondary | ICD-10-CM

## 2013-11-05 DIAGNOSIS — Z0000 Encounter for general adult medical examination without abnormal findings: Secondary | ICD-10-CM

## 2013-11-05 DIAGNOSIS — N390 Urinary tract infection, site not specified: Secondary | ICD-10-CM

## 2013-11-05 DIAGNOSIS — E559 Vitamin D deficiency, unspecified: Secondary | ICD-10-CM

## 2013-11-05 LAB — CBC WITH DIFFERENTIAL/PLATELET
BASOS PCT: 0 % (ref 0–1)
Basophils Absolute: 0 10*3/uL (ref 0.0–0.1)
EOS PCT: 2 % (ref 0–5)
Eosinophils Absolute: 0.2 10*3/uL (ref 0.0–0.7)
HEMATOCRIT: 39.5 % (ref 36.0–46.0)
HEMOGLOBIN: 13.8 g/dL (ref 12.0–15.0)
LYMPHS PCT: 19 % (ref 12–46)
Lymphs Abs: 1.7 10*3/uL (ref 0.7–4.0)
MCH: 30.5 pg (ref 26.0–34.0)
MCHC: 34.9 g/dL (ref 30.0–36.0)
MCV: 87.2 fL (ref 78.0–100.0)
MONO ABS: 0.6 10*3/uL (ref 0.1–1.0)
MONOS PCT: 7 % (ref 3–12)
NEUTROS ABS: 6.3 10*3/uL (ref 1.7–7.7)
Neutrophils Relative %: 72 % (ref 43–77)
Platelets: 278 10*3/uL (ref 150–400)
RBC: 4.53 MIL/uL (ref 3.87–5.11)
RDW: 12.9 % (ref 11.5–15.5)
WBC: 8.7 10*3/uL (ref 4.0–10.5)

## 2013-11-05 MED ORDER — PHENTERMINE HCL 37.5 MG PO TABS: 37.5000 mg | ORAL_TABLET | Freq: Every day | ORAL | Status: DC

## 2013-11-05 MED ORDER — ALPRAZOLAM 1 MG PO TABS: 1.0000 mg | ORAL_TABLET | Freq: Two times a day (BID) | ORAL | Status: DC | PRN

## 2013-11-05 NOTE — Patient Instructions (Signed)
Tension Headache  A tension headache is pain, pressure, or aching felt over the front and sides of the head. Tension headaches often come after stress, feeling worried (anxiety), or feeling sad or down for a while (depressed).  HOME CARE  · Only take medicine as told by your doctor.  · Lie down in a dark, quiet room when you have a headache.  · Keep a journal to find out if certain things bring on headaches. For example, write down:  · What you eat and drink.  · How much sleep you get.  · Any change to your diet or medicines.  · Relax by getting a massage or doing other relaxing activities.  · Put ice or heat packs on the head and neck area as told by your doctor.  · Lessen stress.  · Sit up straight. Do not tighten (tense) your muscles.  · Quit smoking if you smoke.  · Lessen how much alcohol you drink.  · Lessen how much caffeine you drink, or stop drinking caffeine.  · Eat and exercise regularly.  · Get enough sleep.  · Avoid using too much pain medicine.  GET HELP RIGHT AWAY IF:   · Your headache becomes really bad.  · You have a fever.  · You have a stiff neck.  · You have trouble seeing.  · Your muscles are weak, or you lose muscle control.  · You lose your balance or have trouble walking.  · You feel like you will pass out (faint), or you pass out.  · You have really bad symptoms that are different than your first symptoms.  · You have problems with the medicines given to you by your doctor.  · Your medicines do not work.  · Your headache feels different than the other headaches.  · You feel sick to your stomach (nauseous) or throw up (vomit).  MAKE SURE YOU:   · Understand these instructions.  · Will watch your condition.  · Will get help right away if you are not doing well or get worse.  Document Released: 08/30/2009 Document Revised: 08/28/2011 Document Reviewed: 05/26/2011  ExitCare® Patient Information ©2014 [employer].

## 2013-11-05 NOTE — Progress Notes (Signed)
Subjective:    Patient ID: Debbie Schroeder, female    DOB: 07-14-1975, 38 y.o.   MRN: [id number]  HPI Comments: 38 yo WF CP. SHe was at 216 last year and has lost down to 189. She is not eating as heatlthy or exercising. She did well on her 3 months of Phentermine w/o any SE and would like to repeat 2nd/ final trial along with improve diet/ exercise.   She notes reflux has improved with decreased stress and weight loss. SHe notes headaches have also improved with decreased stress and taking flexeril QHS.   She has GYN f/u 8/ 2015 she has needed close monitoring for abnormal cells on PAP. Last colposcopy was 3 years ago and will have repeat this year if still abnormal. She is also due to get IUD exchanged.  Anxiety       Medication List       This list is accurate as of: 11/05/13 11:59 PM.  Always use your most recent med list.               ALPRAZolam 1 MG tablet  Commonly known as:  XANAX  Take 1 tablet (1 mg total) by mouth 2 (two) times daily as needed for sleep.     cyclobenzaprine 10 MG tablet  Commonly known as:  FLEXERIL  TAKE 1 TABLET BY MOUTH EVERY NIGHT AT BEDTIME     metFORMIN 500 MG tablet  Commonly known as:  GLUCOPHAGE  Take 1 tablet (500 mg total) by mouth 2 (two) times daily with a meal.     phentermine 37.5 MG tablet  Commonly known as:  ADIPEX-P  Take 1 tablet (37.5 mg total) by mouth daily before breakfast.     ranitidine 150 MG tablet  Commonly known as:  ZANTAC  Take 150 mg by mouth at bedtime as needed for heartburn.       Allergies  Allergen Reactions  . Latex     REACTION: Rash  . Viibryd [Vilazodone Hcl] Diarrhea   Past Medical History  Diagnosis Date  . Hyperlipidemia   . Anxiety   . Depression   . Anemia   . Migraine    Past Surgical History  Procedure Laterality Date  . Cesarean section    . Breast surgery  2000    lumpectomy  . Hemangioma excision  2000   History  Substance Use Topics  . Smoking status: Never Smoker   .  Smokeless tobacco: Not on file  . Alcohol Use: Yes   Family History  Problem Relation Age of Onset  . Hyperlipidemia Father      MAINTENANCE: Colonoscopy:n/a Mammo: 2010/ 2011 Stable lipoma? Repeat 2015 BMD:n/a Pap/ Pelvic: EYE: Dentist:q 4month overdue  IMMUNIZATIONS: Tdap:2014 Pneumovax:n/a Zostavax:n/a Influenza:yearly at work  Patient Care Team: Lucky CowboyWilliam McKeown, MD as PCP - General (Internal Medicine) Javier DockerJeffrey C Beane, MD as Consulting Physician (Orthopedic Surgery) Sherian ReinJody Bovard-Stuckert, MD as Consulting Physician (Obstetrics and Gynecology) [facility]  Review of Systems  All other systems reviewed and are negative.  BP 120/82  Pulse 82  Temp(Src) 98.6 F (37 C) (Temporal)  Resp 18  Ht 5\' 3"  (1.6 m)  Wt 189 lb (85.73 kg)  BMI 33.49 kg/m2     Objective:   Physical Exam  Nursing note and vitals reviewed. Constitutional: She is oriented to person, place, and time. She appears well-developed and well-nourished. No distress.  overweight  HENT:  Head: Normocephalic and atraumatic.  Right Ear: External ear normal.  Left  Ear: External ear normal.  Nose: Nose normal.  Mouth/Throat: Oropharynx is clear and moist.  Eyes: Conjunctivae and EOM are normal. Pupils are equal, round, and reactive to light. Right eye exhibits no discharge. Left eye exhibits no discharge. No scleral icterus.  Neck: Normal range of motion. Neck supple. No JVD present. No tracheal deviation present. No thyromegaly present.  Cardiovascular: Normal rate, regular rhythm, normal heart sounds and intact distal pulses.   Pulmonary/Chest: Effort normal and breath sounds normal.  Abdominal: Soft. Bowel sounds are normal. She exhibits no distension and no mass. There is no tenderness. There is no rebound and no guarding.  Genitourinary:  Def gyn  Musculoskeletal: Normal range of motion. She exhibits no edema and no tenderness.  NO change with left lateral ankle ORIF screws, + palpation  laterally  Lymphadenopathy:    She has no cervical adenopathy.  Neurological: She is alert and oriented to person, place, and time. She has normal reflexes. No cranial nerve deficit. She exhibits normal muscle tone. Coordination normal.  Skin: Skin is warm and dry. No rash noted. No erythema. No pallor.  Right cheek 1-2 mm flat dark NEVI  Psychiatric: She has a normal mood and affect. Her behavior is normal. Judgment and thought content normal.    EKG NSCSPT WNL      Assessment & Plan:  1. CPE- Update screening labs/ History/ Immunizations/ Testing as needed. Advised healthy diet, QD exercise, increase H20 and continue RX/ Vitamins AD.  2. Overweight-Repeat 2nd trial of Phentermine # 1 of 3 of 2nd trial, DIet/ exercise discussed as well as risk with obesity and RX.  3. HA-Continue Migraine/ Tension HA prophylaxis AD  4. Irreg NEVI- Advised DERm evaluation

## 2013-11-06 LAB — MICROALBUMIN / CREATININE URINE RATIO
Creatinine, Urine: 14.5 mg/dL
MICROALB/CREAT RATIO: 34.5 mg/g — AB (ref 0.0–30.0)
Microalb, Ur: 0.5 mg/dL (ref 0.00–1.89)

## 2013-11-06 LAB — HEPATIC FUNCTION PANEL
ALK PHOS: 61 U/L (ref 39–117)
ALT: 16 U/L (ref 0–35)
AST: 14 U/L (ref 0–37)
Albumin: 4.3 g/dL (ref 3.5–5.2)
BILIRUBIN DIRECT: 0.1 mg/dL (ref 0.0–0.3)
BILIRUBIN INDIRECT: 0.4 mg/dL (ref 0.2–1.2)
TOTAL PROTEIN: 7.2 g/dL (ref 6.0–8.3)
Total Bilirubin: 0.5 mg/dL (ref 0.2–1.2)

## 2013-11-06 LAB — HEMOGLOBIN A1C
Hgb A1c MFr Bld: 5.1 % (ref <5.7)
MEAN PLASMA GLUCOSE: 100 mg/dL (ref <117)

## 2013-11-06 LAB — URINALYSIS, MICROSCOPIC ONLY
Bacteria, UA: NONE SEEN
CRYSTALS: NONE SEEN
Casts: NONE SEEN
SQUAMOUS EPITHELIAL / LPF: NONE SEEN

## 2013-11-06 LAB — URINALYSIS, ROUTINE W REFLEX MICROSCOPIC
BILIRUBIN URINE: NEGATIVE
GLUCOSE, UA: NEGATIVE mg/dL
Hgb urine dipstick: NEGATIVE
Ketones, ur: NEGATIVE mg/dL
Nitrite: NEGATIVE
PH: 6.5 (ref 5.0–8.0)
PROTEIN: NEGATIVE mg/dL
Specific Gravity, Urine: <1.005 — ABNORMAL LOW (ref 1.005–1.030)
Urobilinogen, UA: 0.2 mg/dL (ref 0.0–1.0)

## 2013-11-06 LAB — LIPID PANEL
CHOLESTEROL: 177 mg/dL (ref 0–200)
HDL: 47 mg/dL (ref >39)
LDL Cholesterol: 116 mg/dL — ABNORMAL HIGH (ref 0–99)
TRIGLYCERIDES: 71 mg/dL (ref <150)
Total CHOL/HDL Ratio: 3.8 Ratio
VLDL: 14 mg/dL (ref 0–40)

## 2013-11-06 LAB — BASIC METABOLIC PANEL WITH GFR
BUN: 9 mg/dL (ref 6–23)
CO2: 22 mEq/L (ref 19–32)
CREATININE: 0.72 mg/dL (ref 0.50–1.10)
Calcium: 9.4 mg/dL (ref 8.4–10.5)
Chloride: 103 mEq/L (ref 96–112)
GLUCOSE: 83 mg/dL (ref 70–99)
POTASSIUM: 3.9 meq/L (ref 3.5–5.3)
Sodium: 137 mEq/L (ref 135–145)

## 2013-11-06 LAB — TSH: TSH: 1.133 u[IU]/mL (ref 0.350–4.500)

## 2013-11-06 LAB — MAGNESIUM: MAGNESIUM: 1.8 mg/dL (ref 1.5–2.5)

## 2013-11-06 LAB — INSULIN, FASTING: Insulin fasting, serum: 15 u[IU]/mL (ref 3–28)

## 2013-11-06 LAB — VITAMIN D 25 HYDROXY (VIT D DEFICIENCY, FRACTURES): VIT D 25 HYDROXY: 41 ng/mL (ref 30–89)

## 2013-11-07 LAB — URINE CULTURE: Colony Count: 25000

## 2013-12-02 ENCOUNTER — Other Ambulatory Visit: Payer: Self-pay | Admitting: Emergency Medicine

## 2013-12-29 ENCOUNTER — Other Ambulatory Visit: Payer: Self-pay | Admitting: Emergency Medicine

## 2013-12-29 MED ORDER — CYCLOBENZAPRINE HCL 10 MG PO TABS: ORAL_TABLET | ORAL | Status: DC

## 2014-03-17 ENCOUNTER — Ambulatory Visit: Payer: Self-pay | Admitting: Emergency Medicine

## 2014-03-31 ENCOUNTER — Other Ambulatory Visit: Payer: Self-pay | Admitting: Emergency Medicine

## 2014-04-07 ENCOUNTER — Other Ambulatory Visit: Payer: Self-pay | Admitting: Physician Assistant

## 2014-04-07 DIAGNOSIS — F411 Generalized anxiety disorder: Secondary | ICD-10-CM

## 2014-04-07 MED ORDER — ALPRAZOLAM 1 MG PO TABS: 1.0000 mg | ORAL_TABLET | Freq: Two times a day (BID) | ORAL | Status: DC | PRN

## 2014-05-07 ENCOUNTER — Encounter: Payer: Self-pay | Admitting: Emergency Medicine

## 2014-05-07 ENCOUNTER — Ambulatory Visit (INDEPENDENT_AMBULATORY_CARE_PROVIDER_SITE_OTHER): Payer: BC Managed Care – PPO | Admitting: Emergency Medicine

## 2014-05-07 VITALS — BP 108/72 | HR 104 | Temp 98.2°F | Resp 18 | Wt 213.0 lb

## 2014-05-07 DIAGNOSIS — E782 Mixed hyperlipidemia: Secondary | ICD-10-CM

## 2014-05-07 DIAGNOSIS — J040 Acute laryngitis: Secondary | ICD-10-CM

## 2014-05-07 DIAGNOSIS — R739 Hyperglycemia, unspecified: Secondary | ICD-10-CM

## 2014-05-07 LAB — CBC WITH DIFFERENTIAL/PLATELET
Basophils Absolute: 0 10*3/uL (ref 0.0–0.1)
Basophils Relative: 0 % (ref 0–1)
Eosinophils Absolute: 0.2 10*3/uL (ref 0.0–0.7)
Eosinophils Relative: 2 % (ref 0–5)
HCT: 39 % (ref 36.0–46.0)
HEMOGLOBIN: 13.6 g/dL (ref 12.0–15.0)
LYMPHS ABS: 1.9 10*3/uL (ref 0.7–4.0)
LYMPHS PCT: 21 % (ref 12–46)
MCH: 30.6 pg (ref 26.0–34.0)
MCHC: 34.9 g/dL (ref 30.0–36.0)
MCV: 87.8 fL (ref 78.0–100.0)
MPV: 8.8 fL — AB (ref 9.4–12.4)
Monocytes Absolute: 0.8 10*3/uL (ref 0.1–1.0)
Monocytes Relative: 9 % (ref 3–12)
NEUTROS ABS: 6.2 10*3/uL (ref 1.7–7.7)
NEUTROS PCT: 68 % (ref 43–77)
Platelets: 304 10*3/uL (ref 150–400)
RBC: 4.44 MIL/uL (ref 3.87–5.11)
RDW: 13 % (ref 11.5–15.5)
WBC: 9.1 10*3/uL (ref 4.0–10.5)

## 2014-05-07 NOTE — Progress Notes (Signed)
Subjective:    Patient ID: Debbie Schroeder, female    DOB: 09/25/1975, 38 y.o.   MRN: [id number]  HPI Comments: [age] yo WF 3 month f/u for cholesterol/ Blood sugar/ Obesity. She is working nights and has not been on phentermine. She has gained weight. She is not exercising with return to night shift. She is eating healthy for the most part. She has been trying to decrease sodas.   She has had laryngitis x 2 weeks but thinks it is improving. She has tried OTC and rest.   She has been under a little stress with work but manages well.    Lab Results      Component                Value               Date                      WBC                      8.7                 11/05/2013                HGB                      13.8                11/05/2013                HCT                      39.5                11/05/2013                PLT                      278                 11/05/2013                GLUCOSE                  83                  11/05/2013                CHOL                     177                 11/05/2013                TRIG                     71                  11/05/2013                HDL                      47                  11/05/2013  LDLCALC                  116*                11/05/2013                ALT                      16                  11/05/2013                AST                      14                  11/05/2013                NA                       137                 11/05/2013                K                        3.9                 11/05/2013                CL                       103                 11/05/2013                CREATININE               0.72                11/05/2013                BUN                      9                   11/05/2013                CO2                      22                  11/05/2013                TSH                      1.133               11/05/2013                HGBA1C                    5.1                 11/05/2013                MICROALBUR  0.50                11/05/2013                  Medication List       This list is accurate as of: 05/07/14  5:10 PM.  Always use your most recent med list.               ALPRAZolam 1 MG tablet  Commonly known as:  XANAX  Take 1 tablet (1 mg total) by mouth 2 (two) times daily as needed for sleep.     cyclobenzaprine 10 MG tablet  Commonly known as:  FLEXERIL  TAKE 1 TABLET BY MOUTH EVERY NIGHT AT BEDTIME     metFORMIN 500 MG tablet  Commonly known as:  GLUCOPHAGE  Take 1 tablet (500 mg total) by mouth 2 (two) times daily meals.     phentermine 37.5 MG tablet  Commonly known as:  ADIPEX-P  Take 1 tablet (37.5 mg total) by mouth daily before breakfast.     ranitidine 150 MG tablet  Commonly known as:  ZANTAC  Take 150 mg by mouth at bedtime as needed for heartburn.       Allergies  Allergen Reactions  . Latex     REACTION: Rash  . Viibryd [Vilazodone Hcl] Diarrhea   Past Medical History  Diagnosis Date  . Hyperlipidemia   . Anxiety   . Depression   . Anemia   . Migraine     Review of Systems  HENT: Positive for voice change.   All other systems reviewed and are negative.  BP 108/72 mmHg  Pulse 104  Temp(Src) 98.2 F (36.8 C) (Temporal)  Resp 18  Wt 213 lb (96.616 kg)     Objective:   Physical Exam  Constitutional: She is oriented to person, place, and time. She appears well-developed and well-nourished. No distress.  HENT:  Head: Normocephalic and atraumatic.  Right Ear: External ear normal.  Left Ear: External ear normal.  Nose: Nose normal.  Mouth/Throat: Oropharynx is clear and moist.  Hoarse voice  Eyes: Conjunctivae and EOM are normal.  Neck: Normal range of motion. Neck supple. No JVD present. No thyromegaly present.  Cardiovascular: Normal rate, regular rhythm, normal heart sounds and intact distal pulses.   Pulmonary/Chest: Effort normal and breath  sounds normal.  Abdominal: Soft. Bowel sounds are normal. There is no tenderness.  Musculoskeletal: Normal range of motion. She exhibits no edema or tenderness.  Lymphadenopathy:    She has no cervical adenopathy.  Neurological: She is alert and oriented to person, place, and time.  Skin: Skin is warm and dry. No rash noted.  Psychiatric: She has a normal mood and affect. Her behavior is normal. Judgment and thought content normal.  Nursing note and vitals reviewed.       Assessment & Plan:  1. Cholesterol/ elevated BS- recheck labs, Need to eat healthier and exercise AD. Weight loss again discussed.  2. Laryngitis- Warm salt water gargles daily. 1 tsp liquid benadryl + 1 tsp liquid Maalox, MIX/ GARGLE/ SPIT as needed for pain. Rest voice increase water intake. Check labs  3. Stress- Advised restart 10 minute mental breaks with work and try to establish routine, w/c if symptoms increase

## 2014-05-07 NOTE — Patient Instructions (Signed)
Laryngitis °Laryngitis is redness, soreness, and puffiness (inflammation) of the vocal cords. It causes hoarseness, cough, loss of voice, sore throat, and dry throat. It may be caused by: °· Infection. °· Too much smoking. °· Too much talking or yelling. °· Breathing in of toxic fumes. °· Allergies. °· A backup of acid from your stomach.  °HOME CARE °· Drink enough fluids to keep your pee (urine) clear or pale yellow. °· Rest until you no longer have problems or as told by your doctor. °· Breathe in moist air. °· Take all medicine as told by your doctor. °· Do not smoke. °· Talk as little as possible (this includes whispering). °· Write on paper instead of talking until your voice is back to normal. °· Follow up with your doctor if you have not improved after 10 days. °GET HELP IF:  °· You have trouble breathing. °· You cough up blood. °· You have a fever that will not go away. °· You have increasing pain. °· You have trouble swallowing. °MAKE SURE YOU: °· Understand these instructions. °· Will watch your condition. °· Will get help right away if you are not doing well or get worse. °Document Released: 05/25/2011 Document Revised: 08/28/2011 Document Reviewed: 05/25/2011 °ExitCare® Patient Information ©2015 [employer]. This information is not intended to replace advice given to you by your health care provider. Make sure you discuss any questions you have with your health care provider. ° °

## 2014-05-08 LAB — HEPATIC FUNCTION PANEL
ALT: 15 U/L (ref 0–35)
AST: 13 U/L (ref 0–37)
Albumin: 4.1 g/dL (ref 3.5–5.2)
Alkaline Phosphatase: 59 U/L (ref 39–117)
BILIRUBIN INDIRECT: 0.3 mg/dL (ref 0.2–1.2)
Bilirubin, Direct: 0.1 mg/dL (ref 0.0–0.3)
TOTAL PROTEIN: 7 g/dL (ref 6.0–8.3)
Total Bilirubin: 0.4 mg/dL (ref 0.2–1.2)

## 2014-05-08 LAB — BASIC METABOLIC PANEL WITH GFR
BUN: 11 mg/dL (ref 6–23)
CO2: 29 mEq/L (ref 19–32)
Calcium: 9.5 mg/dL (ref 8.4–10.5)
Chloride: 105 mEq/L (ref 96–112)
Creat: 0.81 mg/dL (ref 0.50–1.10)
GFR, Est African American: >89 mL/min
GFR, Est Non African American: >89 mL/min
Glucose, Bld: 93 mg/dL (ref 70–99)
Potassium: 4.2 mEq/L (ref 3.5–5.3)
SODIUM: 138 meq/L (ref 135–145)

## 2014-05-08 LAB — INSULIN, FASTING: Insulin fasting, serum: 9.1 u[IU]/mL (ref 2.0–19.6)

## 2014-05-08 LAB — LIPID PANEL
Cholesterol: 193 mg/dL (ref 0–200)
HDL: 38 mg/dL — ABNORMAL LOW (ref >39)
LDL CALC: 133 mg/dL — AB (ref 0–99)
Total CHOL/HDL Ratio: 5.1 Ratio
Triglycerides: 112 mg/dL (ref <150)
VLDL: 22 mg/dL (ref 0–40)

## 2014-05-08 LAB — HEMOGLOBIN A1C
HEMOGLOBIN A1C: 5.3 % (ref <5.7)
MEAN PLASMA GLUCOSE: 105 mg/dL (ref <117)

## 2014-06-01 ENCOUNTER — Other Ambulatory Visit: Payer: Self-pay | Admitting: Physician Assistant

## 2014-06-01 DIAGNOSIS — F411 Generalized anxiety disorder: Secondary | ICD-10-CM

## 2014-06-01 MED ORDER — ALPRAZOLAM 1 MG PO TABS: 1.0000 mg | ORAL_TABLET | Freq: Two times a day (BID) | ORAL | Status: DC | PRN

## 2014-07-25 ENCOUNTER — Other Ambulatory Visit: Payer: Self-pay | Admitting: Physician Assistant

## 2014-08-13 ENCOUNTER — Ambulatory Visit: Payer: Self-pay | Admitting: Physician Assistant

## 2014-09-17 ENCOUNTER — Encounter: Payer: Self-pay | Admitting: Emergency Medicine

## 2014-09-17 ENCOUNTER — Ambulatory Visit (INDEPENDENT_AMBULATORY_CARE_PROVIDER_SITE_OTHER): Payer: BLUE CROSS/BLUE SHIELD | Admitting: Internal Medicine

## 2014-09-17 VITALS — BP 124/86 | HR 84 | Temp 98.0°F | Resp 18 | Ht 63.0 in | Wt 213.0 lb

## 2014-09-17 DIAGNOSIS — E7800 Pure hypercholesterolemia, unspecified: Secondary | ICD-10-CM

## 2014-09-17 DIAGNOSIS — R7309 Other abnormal glucose: Secondary | ICD-10-CM

## 2014-09-17 DIAGNOSIS — R7303 Prediabetes: Secondary | ICD-10-CM

## 2014-09-17 DIAGNOSIS — R5383 Other fatigue: Secondary | ICD-10-CM

## 2014-09-17 DIAGNOSIS — E559 Vitamin D deficiency, unspecified: Secondary | ICD-10-CM

## 2014-09-17 DIAGNOSIS — Z79899 Other long term (current) drug therapy: Secondary | ICD-10-CM

## 2014-09-17 LAB — CBC WITH DIFFERENTIAL/PLATELET
BASOS ABS: 0.1 10*3/uL (ref 0.0–0.1)
Basophils Relative: 1 % (ref 0–1)
EOS ABS: 0.3 10*3/uL (ref 0.0–0.7)
EOS PCT: 4 % (ref 0–5)
HCT: 39.8 % (ref 36.0–46.0)
Hemoglobin: 13.8 g/dL (ref 12.0–15.0)
Lymphocytes Relative: 22 % (ref 12–46)
Lymphs Abs: 1.9 10*3/uL (ref 0.7–4.0)
MCH: 30.6 pg (ref 26.0–34.0)
MCHC: 34.7 g/dL (ref 30.0–36.0)
MCV: 88.2 fL (ref 78.0–100.0)
MPV: 9 fL (ref 8.6–12.4)
Monocytes Absolute: 0.8 10*3/uL (ref 0.1–1.0)
Monocytes Relative: 9 % (ref 3–12)
Neutro Abs: 5.6 10*3/uL (ref 1.7–7.7)
Neutrophils Relative %: 64 % (ref 43–77)
PLATELETS: 269 10*3/uL (ref 150–400)
RBC: 4.51 MIL/uL (ref 3.87–5.11)
RDW: 12.8 % (ref 11.5–15.5)
WBC: 8.7 10*3/uL (ref 4.0–10.5)

## 2014-09-17 NOTE — Progress Notes (Signed)
Patient ID: Debbie Schroeder, female   DOB: April 04, 1976, 39 y.o.   MRN: [id number]  Assessment and Plan:  Hypertension:  -Continue medication,  -monitor blood pressure at home.  -Continue DASH diet.   -Reminder to go to the ER if any CP, SOB, nausea, dizziness, severe HA, changes vision/speech, left arm numbness and tingling, and jaw pain.  Cholesterol: -Continue diet and exercise.  -Check cholesterol.   Pre-diabetes: -Continue diet and exercise.  -Check A1C  Vitamin D Def: -check level -continue medications.   Continue diet and meds as discussed. Further disposition pending results of labs.  HPI 39 y.o. female  presents for 3 month follow up with hypertension, hyperlipidemia, prediabetes and vitamin D.   Her blood pressure has been controlled at home, today their BP is BP: 124/86 mmHg.   She does not workout.  She plans to start working out.  She is going to a gym but she is trying to figure out a schedule.  She denies chest pain, shortness of breath, dizziness.   She is not on cholesterol medication and denies myalgias. Her cholesterol is not at goal. The cholesterol last visit was:   Lab Results  Component Value Date   CHOL 193 05/07/2014   HDL 38* 05/07/2014   LDLCALC 133* 05/07/2014   TRIG 112 05/07/2014   CHOLHDL 5.1 05/07/2014     She has not been working on diet and exercise for prediabetes, and denies foot ulcerations, hyperglycemia, hypoglycemia , increased appetite, nausea, paresthesia of the feet, polydipsia, polyuria, visual disturbances, vomiting and weight loss. Last A1C in the office was:  Lab Results  Component Value Date   HGBA1C 5.3 05/07/2014    Patient is on Vitamin D supplement.  Lab Results  Component Value Date   VD25OH 41 11/05/2013      She is under stress.  She wants to start losing weight again.  She also reports that her indigestion is acting up.  She is taking ranitidine.    Current Medications:  Current Outpatient Prescriptions on File  Prior to Visit  Medication Sig Dispense Refill  . ALPRAZolam (XANAX) 1 MG tablet Take 1 tablet (1 mg total) by mouth 2 (two) times daily as needed for sleep. 60 tablet 1  . cyclobenzaprine (FLEXERIL) 10 MG tablet TAKE 1 TABLET BY MOUTH EVERY NIGHT AT BEDTIME 90 tablet 0  . Melatonin 2.5-338 MG-MCG SUBL Place 2.5 mg under the tongue.    . metFORMIN (GLUCOPHAGE) 500 MG tablet Take 1 tablet (500 mg total) by mouth 2 (two) times daily meals. 180 tablet 1  . ranitidine (ZANTAC) 150 MG tablet Take 150 mg by mouth at bedtime as needed for heartburn.    . phentermine (ADIPEX-P) 37.5 MG tablet Take 1 tablet (37.5 mg total) by mouth daily before breakfast. (Patient not taking: Reported on 09/17/2014) 30 tablet 0   No current facility-administered medications on file prior to visit.    Medical History:  Past Medical History  Diagnosis Date  . Hyperlipidemia   . Anxiety   . Depression   . Anemia   . Migraine     Allergies:  Allergies  Allergen Reactions  . Latex     REACTION: Rash  . Viibryd [Vilazodone Hcl] Diarrhea     Review of Systems:  Review of Systems  Constitutional: Negative for fever, chills and malaise/fatigue.  HENT: Negative for congestion, nosebleeds, sore throat and tinnitus.   Eyes: Negative.   Respiratory: Negative for cough, sputum production, shortness of breath and  wheezing.   Cardiovascular: Negative for chest pain, palpitations and leg swelling.  Gastrointestinal: Positive for heartburn. Negative for nausea, vomiting, abdominal pain, diarrhea, constipation, blood in stool and melena.  Genitourinary: Negative.   Skin: Negative.   Neurological: Negative for dizziness, sensory change, focal weakness and headaches.    Family history- Review and unchanged  Social history- Review and unchanged  Physical Exam: BP 124/86 mmHg  Pulse 84  Temp(Src) 98 F (36.7 C) (Temporal)  Resp 18  Ht 5\' 3"  (1.6 m)  Wt 213 lb (96.616 kg)  BMI 37.74 kg/m2 Wt Readings from Last  3 Encounters:  09/17/14 213 lb (96.616 kg)  05/07/14 213 lb (96.616 kg)  11/05/13 189 lb (85.73 kg)    General Appearance: Obese, Well nourished well developed, in no apparent distress. Eyes: PERRLA, EOMs, conjunctiva no swelling or erythema ENT/Mouth: Ear canals normal without obstruction, swelling, erythma, discharge.  TMs normal bilaterally.  Oropharynx moist, clear, without exudate, or postoropharyngeal swelling. Neck: Supple, thyroid normal,no cervical adenopathy  Respiratory: Respiratory effort normal, Breath sounds clear A&P without rhonchi, wheeze, or rale.  No retractions, no accessory usage. Cardio: RRR with no MRGs. Brisk peripheral pulses without edema.  Abdomen: Obese, Soft, + BS,  Non tender, no guarding, rebound, hernias, masses. Musculoskeletal: Full ROM, 5/5 strength, Normal gait Skin: Warm, dry without rashes, lesions, ecchymosis.  Neuro: Awake and oriented X 3, Cranial nerves intact. Normal muscle tone, no cerebellar symptoms. Psych: Normal affect, Insight and Judgment appropriate.    FORCUCCI, Caidyn Henricksen, PA-C 4:50 PM [facility]

## 2014-09-17 NOTE — Patient Instructions (Signed)

## 2014-09-18 LAB — TSH: TSH: 1.895 u[IU]/mL (ref 0.350–4.500)

## 2014-09-18 LAB — HEPATIC FUNCTION PANEL
ALK PHOS: 56 U/L (ref 39–117)
ALT: 12 U/L (ref 0–35)
AST: 11 U/L (ref 0–37)
Albumin: 4.1 g/dL (ref 3.5–5.2)
BILIRUBIN DIRECT: 0.1 mg/dL (ref 0.0–0.3)
BILIRUBIN INDIRECT: 0.3 mg/dL (ref 0.2–1.2)
Total Bilirubin: 0.4 mg/dL (ref 0.2–1.2)
Total Protein: 7 g/dL (ref 6.0–8.3)

## 2014-09-18 LAB — VITAMIN D 25 HYDROXY (VIT D DEFICIENCY, FRACTURES): Vit D, 25-Hydroxy: 28 ng/mL — ABNORMAL LOW (ref 30–100)

## 2014-09-18 LAB — BASIC METABOLIC PANEL WITH GFR
BUN: 11 mg/dL (ref 6–23)
CO2: 25 mEq/L (ref 19–32)
CREATININE: 0.81 mg/dL (ref 0.50–1.10)
Calcium: 9.4 mg/dL (ref 8.4–10.5)
Chloride: 104 mEq/L (ref 96–112)
GFR, Est African American: >89 mL/min
GFR, Est Non African American: >89 mL/min
GLUCOSE: 95 mg/dL (ref 70–99)
POTASSIUM: 4.2 meq/L (ref 3.5–5.3)
Sodium: 138 mEq/L (ref 135–145)

## 2014-09-18 LAB — LIPID PANEL
CHOLESTEROL: 179 mg/dL (ref 0–200)
HDL: 42 mg/dL — ABNORMAL LOW (ref >=46)
LDL Cholesterol: 125 mg/dL — ABNORMAL HIGH (ref 0–99)
TRIGLYCERIDES: 61 mg/dL (ref <150)
Total CHOL/HDL Ratio: 4.3 Ratio
VLDL: 12 mg/dL (ref 0–40)

## 2014-09-18 LAB — INSULIN, FASTING: Insulin fasting, serum: 10.1 u[IU]/mL (ref 2.0–19.6)

## 2014-09-18 LAB — MAGNESIUM: Magnesium: 2 mg/dL (ref 1.5–2.5)

## 2014-09-18 LAB — HEMOGLOBIN A1C
Hgb A1c MFr Bld: 5.3 % (ref <5.7)
MEAN PLASMA GLUCOSE: 105 mg/dL (ref <117)

## 2014-09-21 ENCOUNTER — Other Ambulatory Visit: Payer: Self-pay | Admitting: Physician Assistant

## 2014-09-21 DIAGNOSIS — F411 Generalized anxiety disorder: Secondary | ICD-10-CM

## 2014-09-22 ENCOUNTER — Other Ambulatory Visit: Payer: Self-pay | Admitting: Physician Assistant

## 2014-11-11 ENCOUNTER — Encounter: Payer: Self-pay | Admitting: Emergency Medicine

## 2014-11-19 ENCOUNTER — Other Ambulatory Visit: Payer: Self-pay | Admitting: Internal Medicine

## 2014-11-23 ENCOUNTER — Encounter: Payer: Self-pay | Admitting: *Deleted

## 2014-12-15 ENCOUNTER — Encounter: Payer: Self-pay | Admitting: *Deleted

## 2015-01-19 ENCOUNTER — Other Ambulatory Visit: Payer: Self-pay | Admitting: Internal Medicine

## 2015-01-20 ENCOUNTER — Other Ambulatory Visit: Payer: Self-pay | Admitting: *Deleted

## 2015-01-20 MED ORDER — ALPRAZOLAM 1 MG PO TABS: 1.0000 mg | ORAL_TABLET | Freq: Two times a day (BID) | ORAL | Status: DC

## 2015-01-20 NOTE — Telephone Encounter (Signed)
Rx called into [place].

## 2015-02-11 ENCOUNTER — Encounter: Payer: Self-pay | Admitting: Physician Assistant

## 2015-02-11 ENCOUNTER — Ambulatory Visit (INDEPENDENT_AMBULATORY_CARE_PROVIDER_SITE_OTHER): Payer: BLUE CROSS/BLUE SHIELD | Admitting: Physician Assistant

## 2015-02-11 VITALS — BP 122/84 | HR 96 | Temp 98.4°F | Resp 18 | Ht 63.0 in | Wt 202.0 lb

## 2015-02-11 DIAGNOSIS — Z0000 Encounter for general adult medical examination without abnormal findings: Secondary | ICD-10-CM

## 2015-02-11 DIAGNOSIS — F329 Major depressive disorder, single episode, unspecified: Secondary | ICD-10-CM

## 2015-02-11 DIAGNOSIS — E559 Vitamin D deficiency, unspecified: Secondary | ICD-10-CM | POA: Diagnosis not present

## 2015-02-11 DIAGNOSIS — Z79899 Other long term (current) drug therapy: Secondary | ICD-10-CM

## 2015-02-11 DIAGNOSIS — D649 Anemia, unspecified: Secondary | ICD-10-CM

## 2015-02-11 DIAGNOSIS — F32A Depression, unspecified: Secondary | ICD-10-CM

## 2015-02-11 DIAGNOSIS — E669 Obesity, unspecified: Secondary | ICD-10-CM

## 2015-02-11 DIAGNOSIS — R7303 Prediabetes: Secondary | ICD-10-CM

## 2015-02-11 DIAGNOSIS — E7800 Pure hypercholesterolemia, unspecified: Secondary | ICD-10-CM

## 2015-02-11 LAB — CBC WITH DIFFERENTIAL/PLATELET
Basophils Absolute: 0 10*3/uL (ref 0.0–0.1)
Basophils Relative: 0 % (ref 0–1)
Eosinophils Absolute: 0.2 10*3/uL (ref 0.0–0.7)
Eosinophils Relative: 2 % (ref 0–5)
HCT: 41.3 % (ref 36.0–46.0)
HEMOGLOBIN: 14.2 g/dL (ref 12.0–15.0)
LYMPHS ABS: 1.9 10*3/uL (ref 0.7–4.0)
Lymphocytes Relative: 23 % (ref 12–46)
MCH: 30.7 pg (ref 26.0–34.0)
MCHC: 34.4 g/dL (ref 30.0–36.0)
MCV: 89.4 fL (ref 78.0–100.0)
MONO ABS: 0.8 10*3/uL (ref 0.1–1.0)
MONOS PCT: 10 % (ref 3–12)
MPV: 9.2 fL (ref 8.6–12.4)
NEUTROS ABS: 5.3 10*3/uL (ref 1.7–7.7)
NEUTROS PCT: 65 % (ref 43–77)
Platelets: 257 10*3/uL (ref 150–400)
RBC: 4.62 MIL/uL (ref 3.87–5.11)
RDW: 13 % (ref 11.5–15.5)
WBC: 8.1 10*3/uL (ref 4.0–10.5)

## 2015-02-11 MED ORDER — CYCLOBENZAPRINE HCL 10 MG PO TABS
10.0000 mg | ORAL_TABLET | Freq: Every day | ORAL | Status: AC
Start: 2015-02-11 — End: ?

## 2015-02-11 NOTE — Progress Notes (Signed)
Complete Physical  Assessment and Plan: 1. Pure hypercholesterolemia -continue medications, check lipids, decrease fatty foods, increase activity.  - CBC with Differential/Platelet - BASIC METABOLIC PANEL WITH GFR - Hepatic function panel - TSH - Lipid panel  2. Depression remission  3. Prediabetes Discussed general issues about diabetes pathophysiology and management., Educational material distributed., Suggested low cholesterol diet., Encouraged aerobic exercise., Discussed foot care., Reminded to get yearly retinal exam. - Hemoglobin A1c - Insulin, fasting  4. Obese Obesity with co morbidities- long discussion about weight loss, diet, and exercise - TSH  5. Vitamin D deficiency - Vit D  25 hydroxy (rtn osteoporosis monitoring)  6. Medication management - Magnesium  7. Routine general medical examination at a health care facility - CBC with Differential/Platelet - BASIC METABOLIC PANEL WITH GFR - Hepatic function panel - TSH - Lipid panel - Hemoglobin A1c - Insulin, fasting - Magnesium - Vit D  25 hydroxy (rtn osteoporosis monitoring) - Iron and TIBC - Ferritin - Vitamin B12  8. Anemia, unspecified anemia type - Iron and TIBC - Ferritin - Vitamin B12   Discussed med's effects and SE's. Screening labs and tests as requested with regular follow-up as recommended. Over 40 minutes of exam, counseling, chart review and critical decision making was performed  HPI  This very nice 39 y.o.female presents for complete physical.  Patient has no major health issues.  Patient reports no complaints at this time.  She does workout.  Finally, patient has history of Vitamin D Deficiency and last vitamin D was  Lab Results  Component Value Date   VD25OH 20* 09/17/2014  Currently on supplementation She works nights but is suppose to be working days but this will not happen until Jan. [employer] forces technical officer, over 4 departments.  She is getting married in 1 month, she has 1 son.   Has had several hematomas,occ trouble with stopping bleeding.  BMI is Body mass index is 35.79 kg/(m^2)., she is working on diet and exercise. She could not tolerate phentermine and is on plexus. She has lost 16 lbs since June.  Wt Readings from Last 3 Encounters:  02/11/15 202 lb (91.627 kg)  09/17/14 213 lb (96.616 kg)  05/07/14 213 lb (96.616 kg)    Current Medications:  Current Outpatient Prescriptions on File Prior to Visit  Medication Sig Dispense Refill  . ALPRAZolam (XANAX) 1 MG tablet Take 1 tablet (1 mg total) by mouth 2 (two) times daily. 60 tablet 0  . cyclobenzaprine (FLEXERIL) 10 MG tablet TAKE 1 TABLET BY MOUTH EVERY NIGHT AT BEDTIME 90 tablet 0  . Melatonin 2.5-338 MG-MCG SUBL Place 2.5 mg under the tongue.    . ranitidine (ZANTAC) 150 MG tablet Take 150 mg by mouth at bedtime as needed for heartburn.     No current facility-administered medications on file prior to visit.   Health Maintenance:   Immunization History  Administered Date(s) Administered  . HPV Quadrivalent 04/24/2011, 06/21/2011, 10/19/2011  . Tdap 10/15/2012   TD/TDAP: 2014 HPV vaccine: completed Influenza: N/A Pneumovax: N/A Prevnar 13: N/A  LMP: No LMP recorded. Patient is not currently having periods (Reason: IUD). Pap: Dr. Ellyn Hack, due, have had abnormal, cryo 2 years ago MGM: 2010, will get this year Last Dental Exam:  Last Eye Exam:  Allergies:  Allergies  Allergen Reactions  . Latex     REACTION: Rash  . Viibryd [Vilazodone Hcl] Diarrhea   Medical History:  Past Medical History  Diagnosis Date  . Hyperlipidemia   . Anxiety   .  Depression   . Anemia   . Migraine    Surgical History:  Past Surgical History  Procedure Laterality Date  . Cesarean section    . Breast surgery  2000    lumpectomy  . Hemangioma excision  2000   Family History:  Family History  Problem Relation Age of Onset  . Hyperlipidemia Father    Social History:  Social History  Substance Use  Topics  . Smoking status: Never Smoker   . Smokeless tobacco: None  . Alcohol Use: Yes    Review of Systems: Review of Systems  Constitutional: Negative.   HENT: Negative.   Eyes: Negative.   Respiratory: Negative.   Cardiovascular: Negative.   Gastrointestinal: Negative.   Genitourinary: Negative.   Musculoskeletal: Negative.   Skin: Negative.   Neurological: Negative.   Endo/Heme/Allergies: Negative.   Psychiatric/Behavioral: Negative.     Physical Exam: Estimated body mass index is 35.79 kg/(m^2) as calculated from the following:   Height as of this encounter: 5\' 3"  (1.6 m).   Weight as of this encounter: 202 lb (91.627 kg). Ht 5\' 3"  (1.6 m)  Wt 202 lb (91.627 kg)  BMI 35.79 kg/m2 General Appearance: Well nourished, in no apparent distress.  Eyes: PERRLA, EOMs, conjunctiva no swelling or erythema, normal fundi and vessels.  Sinuses: No Frontal/maxillary tenderness  ENT/Mouth: Ext aud canals clear, normal light reflex with TMs without erythema, bulging. Good dentition. No erythema, swelling, or exudate on post pharynx. Tonsils not swollen or erythematous. Hearing normal.  Neck: Supple, thyroid normal. No bruits  Respiratory: Respiratory effort normal, BS equal bilaterally without rales, rhonchi, wheezing or stridor.  Cardio: RRR without murmurs, rubs or gallops. Brisk peripheral pulses without edema.  Chest: symmetric, with normal excursions and percussion.  Breasts: defer  Abdomen: Soft, nontender, no guarding, rebound, hernias, masses, or organomegaly.  Lymphatics: Non tender without lymphadenopathy.  Genitourinary: defer Musculoskeletal: Full ROM all peripheral extremities,5/5 strength, and normal gait.  Skin: Warm, dry without rashes, lesions, ecchymosis. Neuro: Cranial nerves intact, reflexes equal bilaterally. Normal muscle tone, no cerebellar symptoms. Sensation intact.  Psych: Awake and oriented X 3, normal affect, Insight and Judgment appropriate.   EKG:  defer  Quentin Mulling 3:11 PM [facility]

## 2015-02-11 NOTE — Patient Instructions (Signed)
What is the TMJ? The temporomandibular (tem-PUH-ro-man-DIB-yoo-ler) joint, or the TMJ, connects the upper and lower jawbones. This joint allows the jaw to open wide and move back and forth when you chew, talk, or yawn.There are also several muscles that help this joint move. There can be muscle tightness and pain in the muscle that can cause several symptoms.  What causes TMJ pain? There are many causes of TMJ pain. Repeated chewing (for example, chewing gum) and clenching your teeth can cause pain in the joint. Some TMJ pain has no obvious cause. What can I do to ease the pain? There are many things you can do to help your pain get better. When you have pain:  Eat soft foods and stay away from chewy foods (for example, taffy) Try to use both sides of your mouth to chew Don't chew gum Massage Don't open your mouth wide (for example, during yawning or singing) Don't bite your cheeks or fingernails Lower your amount of stress and worry Applying a warm, damp washcloth to the joint may help. Over-the-counter pain medicines such as ibuprofen (one brand: Advil) or acetaminophen (one brand: Tylenol) might also help. Do not use these medicines if you are allergic to them or if your doctor told you not to use them. How can I stop the pain from coming back? When your pain is better, you can do these exercises to make your muscles stronger and to keep the pain from coming back:  Resisted mouth opening: Place your thumb or two fingers under your chin and open your mouth slowly, pushing up lightly on your chin with your thumb. Hold for three to six seconds. Close your mouth slowly. Resisted mouth closing: Place your thumbs under your chin and your two index fingers on the ridge between your mouth and the bottom of your chin. Push down lightly on your chin as you close your mouth. Tongue up: Slowly open and close your mouth while keeping the tongue touching the roof of the mouth. Side-to-side jaw movement:  Place an object about one fourth of an inch thick (for example, two tongue depressors) between your front teeth. Slowly move your jaw from side to side. Increase the thickness of the object as the exercise becomes easier Forward jaw movement: Place an object about one fourth of an inch thick between your front teeth and move the bottom jaw forward so that the bottom teeth are in front of the top teeth. Increase the thickness of the object as the exercise becomes easier. These exercises should not be painful. If it hurts to do these exercises, stop doing them and talk to your family doctor.     Preventive Care for Adults  A healthy lifestyle and preventive care can promote health and wellness. Preventive health guidelines for women include the following key practices.  A routine yearly physical is a good way to check with your health care provider about your health and preventive screening. It is a chance to share any concerns and updates on your health and to receive a thorough exam.  Visit your dentist for a routine exam and preventive care every 6 months. Brush your teeth twice a day and floss once a day. Good oral hygiene prevents tooth decay and gum disease.  The frequency of eye exams is based on your age, health, family medical history, use of contact lenses, and other factors. Follow your health care provider's recommendations for frequency of eye exams.  Eat a healthy diet. Foods like vegetables, fruits,  whole grains, low-fat dairy products, and lean protein foods contain the nutrients you need without too many calories. Decrease your intake of foods high in solid fats, added sugars, and salt. Eat the right amount of calories for you.Get information about a proper diet from your health care provider, if necessary.  Regular physical exercise is one of the most important things you can do for your health. Most adults should get at least 150 minutes of moderate-intensity exercise (any  activity that increases your heart rate and causes you to sweat) each week. In addition, most adults need muscle-strengthening exercises on 2 or more days a week.  Maintain a healthy weight. The body mass index (BMI) is a screening tool to identify possible weight problems. It provides an estimate of body fat based on height and weight. Your health care provider can find your BMI and can help you achieve or maintain a healthy weight.For adults 20 years and older:  A BMI below 18.5 is considered underweight.  A BMI of 18.5 to 24.9 is normal.  A BMI of 25 to 29.9 is considered overweight.  A BMI of 30 and above is considered obese.  Maintain normal blood lipids and cholesterol levels by exercising and minimizing your intake of saturated fat. Eat a balanced diet with plenty of fruit and vegetables. Blood tests for lipids and cholesterol should begin at age 75 and be repeated every 5 years. If your lipid or cholesterol levels are high, you are over 50, or you are at high risk for heart disease, you may need your cholesterol levels checked more frequently.Ongoing high lipid and cholesterol levels should be treated with medicines if diet and exercise are not working.  If you smoke, find out from your health care provider how to quit. If you do not use tobacco, do not start.  Lung cancer screening is recommended for adults aged 72-80 years who are at high risk for developing lung cancer because of a history of smoking. A yearly low-dose CT scan of the lungs is recommended for people who have at least a 30-pack-year history of smoking and are a current smoker or have quit within the past 15 years. A pack year of smoking is smoking an average of 1 pack of cigarettes a day for 1 year (for example: 1 pack a day for 30 years or 2 packs a day for 15 years). Yearly screening should continue until the smoker has stopped smoking for at least 15 years. Yearly screening should be stopped for people who develop a  health problem that would prevent them from having lung cancer treatment.  High blood pressure causes heart disease and increases the risk of stroke. Your blood pressure should be checked at least every 1 to 2 years. Ongoing high blood pressure should be treated with medicines if weight loss and exercise do not work.  If you are 33-58 years old, ask your health care provider if you should take aspirin to prevent strokes.  Diabetes screening involves taking a blood sample to check your fasting blood sugar level. This should be done once every 3 years, after age 7, if you are within normal weight and without risk factors for diabetes. Testing should be considered at a younger age or be carried out more frequently if you are overweight and have at least 1 risk factor for diabetes.  Breast cancer screening is essential preventive care for women. You should practice "breast self-awareness." This means understanding the normal appearance and feel of your breasts  and may include breast self-examination. Any changes detected, no matter how small, should be reported to a health care provider. Women in their 74s and 30s should have a clinical breast exam (CBE) by a health care provider as part of a regular health exam every 1 to 3 years. After age 66, women should have a CBE every year. Starting at age 54, women should consider having a mammogram (breast X-ray test) every year. Women who have a family history of breast cancer should talk to their health care provider about genetic screening. Women at a high risk of breast cancer should talk to their health care providers about having an MRI and a mammogram every year.  Breast cancer gene (BRCA)-related cancer risk assessment is recommended for women who have family members with BRCA-related cancers. BRCA-related cancers include breast, ovarian, tubal, and peritoneal cancers. Having family members with these cancers may be associated with an increased risk for  harmful changes (mutations) in the breast cancer genes BRCA1 and BRCA2. Results of the assessment will determine the need for genetic counseling and BRCA1 and BRCA2 testing.  Routine pelvic exams to screen for cancer are no longer recommended for nonpregnant women who are considered low risk for cancer of the pelvic organs (ovaries, uterus, and vagina) and who do not have symptoms. Ask your health care provider if a screening pelvic exam is right for you.  If you have had past treatment for cervical cancer or a condition that could lead to cancer, you need Pap tests and screening for cancer for at least 20 years after your treatment. If Pap tests have been discontinued, your risk factors (such as having a new sexual partner) need to be reassessed to determine if screening should be resumed. Some women have medical problems that increase the chance of getting cervical cancer. In these cases, your health care provider may recommend more frequent screening and Pap tests.  Colorectal cancer can be detected and often prevented. Most routine colorectal cancer screening begins at the age of 23 years and continues through age 61 years. However, your health care provider may recommend screening at an earlier age if you have risk factors for colon cancer. On a yearly basis, your health care provider may provide home test kits to check for hidden blood in the stool. Use of a small camera at the end of a tube, to directly examine the colon (sigmoidoscopy or colonoscopy), can detect the earliest forms of colorectal cancer. Talk to your health care provider about this at age 42, when routine screening begins. Direct exam of the colon should be repeated every 5-10 years through age 23 years, unless early forms of pre-cancerous polyps or small growths are found.  Hepatitis C blood testing is recommended for all people born from [birth year] through 1965 and any individual with known risks for hepatitis C.  Pra  Osteoporosis is  a disease in which the bones lose minerals and strength with aging. This can result in serious bone fractures or breaks. The risk of osteoporosis can be identified using a bone density scan. Women ages 60 years and over and women at risk for fractures or osteoporosis should discuss screening with their health care providers. Ask your health care provider whether you should take a calcium supplement or vitamin D to reduce the rate of osteoporosis.  Menopause can be associated with physical symptoms and risks. Hormone replacement therapy is available to decrease symptoms and risks. You should talk to your health care provider about whether hormone  replacement therapy is right for you.  Use sunscreen. Apply sunscreen liberally and repeatedly throughout the day. You should seek shade when your shadow is shorter than you. Protect yourself by wearing long sleeves, pants, a wide-brimmed hat, and sunglasses year round, whenever you are outdoors.  Once a month, do a whole body skin exam, using a mirror to look at the skin on your back. Tell your health care provider of new moles, moles that have irregular borders, moles that are larger than a pencil eraser, or moles that have changed in shape or color.  Stay current with required vaccines (immunizations).  Influenza vaccine. All adults should be immunized every year.  Tetanus, diphtheria, and acellular pertussis (Td, Tdap) vaccine. Pregnant women should receive 1 dose of Tdap vaccine during each pregnancy. The dose should be obtained regardless of the length of time since the last dose. Immunization is preferred during the 27th-36th week of gestation. An adult who has not previously received Tdap or who does not know her vaccine status should receive 1 dose of Tdap. This initial dose should be followed by tetanus and diphtheria toxoids (Td) booster doses every 10 years. Adults with an unknown or incomplete history of completing a 3-dose immunization series with  Td-containing vaccines should begin or complete a primary immunization series including a Tdap dose. Adults should receive a Td booster every 10 years.  Varicella vaccine. An adult without evidence of immunity to varicella should receive 2 doses or a second dose if she has previously received 1 dose. Pregnant females who do not have evidence of immunity should receive the first dose after pregnancy. This first dose should be obtained before leaving the health care facility. The second dose should be obtained 4-8 weeks after the first dose.  Human papillomavirus (HPV) vaccine. Females aged 13-26 years who have not received the vaccine previously should obtain the 3-dose series. The vaccine is not recommended for use in pregnant females. However, pregnancy testing is not needed before receiving a dose. If a female is found to be pregnant after receiving a dose, no treatment is needed. In that case, the remaining doses should be delayed until after the pregnancy. Immunization is recommended for any person with an immunocompromised condition through the age of 77 years if she did not get any or all doses earlier. During the 3-dose series, the second dose should be obtained 4-8 weeks after the first dose. The third dose should be obtained 24 weeks after the first dose and 16 weeks after the second dose.  Zoster vaccine. One dose is recommended for adults aged 4 years or older unless certain conditions are present.  Measles, mumps, and rubella (MMR) vaccine. Adults born before [birth year] generally are considered immune to measles and mumps. Adults born in [birth year] or later should have 1 or more doses of MMR vaccine unless there is a contraindication to the vaccine or there is laboratory evidence of immunity to each of the three diseases. A routine second dose of MMR vaccine should be obtained at least 28 days after the first dose for students attending postsecondary schools, health care workers, or international travelers.  People who received inactivated measles vaccine or an unknown type of measles vaccine during 1963-1967 should receive 2 doses of MMR vaccine. People who received inactivated mumps vaccine or an unknown type of mumps vaccine before 1979 and are at high risk for mumps infection should consider immunization with 2 doses of MMR vaccine. For females of childbearing age, rubella immunity should be  determined. If there is no evidence of immunity, females who are not pregnant should be vaccinated. If there is no evidence of immunity, females who are pregnant should delay immunization until after pregnancy. Unvaccinated health care workers born before [birth year] who lack laboratory evidence of measles, mumps, or rubella immunity or laboratory confirmation of disease should consider measles and mumps immunization with 2 doses of MMR vaccine or rubella immunization with 1 dose of MMR vaccine.  Pneumococcal 13-valent conjugate (PCV13) vaccine. When indicated, a person who is uncertain of her immunization history and has no record of immunization should receive the PCV13 vaccine. An adult aged 56 years or older who has certain medical conditions and has not been previously immunized should receive 1 dose of PCV13 vaccine. This PCV13 should be followed with a dose of pneumococcal polysaccharide (PPSV23) vaccine. The PPSV23 vaccine dose should be obtained at least 8 weeks after the dose of PCV13 vaccine. An adult aged 68 years or older who has certain medical conditions and previously received 1 or more doses of PPSV23 vaccine should receive 1 dose of PCV13. The PCV13 vaccine dose should be obtained 1 or more years after the last PPSV23 vaccine dose.    Pneumococcal polysaccharide (PPSV23) vaccine. When PCV13 is also indicated, PCV13 should be obtained first. All adults aged 65 years and older should be immunized. An adult younger than age 64 years who has certain medical conditions should be immunized. Any person who resides in a  nursing home or long-term care facility should be immunized. An adult smoker should be immunized. People with an immunocompromised condition and certain other conditions should receive both PCV13 and PPSV23 vaccines. People with human immunodeficiency virus (HIV) infection should be immunized as soon as possible after diagnosis. Immunization during chemotherapy or radiation therapy should be avoided. Routine use of PPSV23 vaccine is not recommended for American Indians, [place] Natives, or people younger than 65 years unless there are medical conditions that require PPSV23 vaccine. When indicated, people who have unknown immunization and have no record of immunization should receive PPSV23 vaccine. One-time revaccination 5 years after the first dose of PPSV23 is recommended for people aged 19-64 years who have chronic kidney failure, nephrotic syndrome, asplenia, or immunocompromised conditions. People who received 1-2 doses of PPSV23 before age 85 years should receive another dose of PPSV23 vaccine at age 65 years or later if at least 5 years have passed since the previous dose. Doses of PPSV23 are not needed for people immunized with PPSV23 at or after age 43 years.  Preventive Services / Frequency   Ages 34 to 57 years  Blood pressure check.  Lipid and cholesterol check.  Lung cancer screening. / Every year if you are aged 67-80 years and have a 30-pack-year history of smoking and currently smoke or have quit within the past 15 years. Yearly screening is stopped once you have quit smoking for at least 15 years or develop a health problem that would prevent you from having lung cancer treatment.  Clinical breast exam.** / Every year after age 54 years.  BRCA-related cancer risk assessment.** / For women who have family members with a BRCA-related cancer (breast, ovarian, tubal, or peritoneal cancers).  Mammogram.** / Every year beginning at age 35 years and continuing for as long as you are in  good health. Consult with your health care provider.  Pap test.** / Every 3 years starting at age 3 years through age 78 or 49 years with a history of 3 consecutive normal  Pap tests.  HPV screening.** / Every 3 years from ages 18 years through ages 18 to 69 years with a history of 3 consecutive normal Pap tests.  Fecal occult blood test (FOBT) of stool. / Every year beginning at age 26 years and continuing until age 54 years. You may not need to do this test if you get a colonoscopy every 10 years.  Flexible sigmoidoscopy or colonoscopy.** / Every 5 years for a flexible sigmoidoscopy or every 10 years for a colonoscopy beginning at age 84 years and continuing until age 45 years.  Hepatitis C blood test.** / For all people born from [birth year] through 1966 and any individual with known risks for hepatitis C.  Skin self-exam. / Monthly.  Influenza vaccine. / Every year.  Tetanus, diphtheria, and acellular pertussis (Tdap/Td) vaccine.** / Consult your health care provider. Pregnant women should receive 1 dose of Tdap vaccine during each pregnancy. 1 dose of Td every 10 years.  Varicella vaccine.** / Consult your health care provider. Pregnant females who do not have evidence of immunity should receive the first dose after pregnancy.  Zoster vaccine.** / 1 dose for adults aged 82 years or older.  Pneumococcal 13-valent conjugate (PCV13) vaccine.** / Consult your health care provider.  Pneumococcal polysaccharide (PPSV23) vaccine.** / 1 to 2 doses if you smoke cigarettes or if you have certain conditions.  Meningococcal vaccine.** / Consult your health care provider.  Hepatitis A vaccine.** / Consult your health care provider.  Hepatitis B vaccine.** / Consult your health care provider. Screening for abdominal aortic aneurysm (AAA)  by ultrasound is recommended for people over 50 who have history of high blood pressure or who are current or former smokers.

## 2015-02-12 LAB — HEPATIC FUNCTION PANEL
ALBUMIN: 4.4 g/dL (ref 3.6–5.1)
ALT: 19 U/L (ref 6–29)
AST: 18 U/L (ref 10–30)
Alkaline Phosphatase: 48 U/L (ref 33–115)
Bilirubin, Direct: 0.1 mg/dL (ref <=0.2)
Indirect Bilirubin: 0.4 mg/dL (ref 0.2–1.2)
TOTAL PROTEIN: 6.9 g/dL (ref 6.1–8.1)
Total Bilirubin: 0.5 mg/dL (ref 0.2–1.2)

## 2015-02-12 LAB — BASIC METABOLIC PANEL WITH GFR
BUN: 15 mg/dL (ref 7–25)
CHLORIDE: 106 mmol/L (ref 98–110)
CO2: 26 mmol/L (ref 20–31)
CREATININE: 0.79 mg/dL (ref 0.50–1.10)
Calcium: 9.6 mg/dL (ref 8.6–10.2)
GFR, Est African American: >89 mL/min (ref >=60)
GFR, Est Non African American: >89 mL/min (ref >=60)
GLUCOSE: 92 mg/dL (ref 65–99)
Potassium: 3.8 mmol/L (ref 3.5–5.3)
Sodium: 139 mmol/L (ref 135–146)

## 2015-02-12 LAB — HEMOGLOBIN A1C
HEMOGLOBIN A1C: 5.1 % (ref <5.7)
MEAN PLASMA GLUCOSE: 100 mg/dL (ref <117)

## 2015-02-12 LAB — VITAMIN B12: Vitamin B-12: 533 pg/mL (ref 211–911)

## 2015-02-12 LAB — TSH: TSH: 1.847 u[IU]/mL (ref 0.350–4.500)

## 2015-02-12 LAB — LIPID PANEL
CHOL/HDL RATIO: 4 ratio (ref <=5.0)
Cholesterol: 172 mg/dL (ref 125–200)
HDL: 43 mg/dL — ABNORMAL LOW (ref >=46)
LDL Cholesterol: 115 mg/dL (ref <130)
Triglycerides: 70 mg/dL (ref <150)
VLDL: 14 mg/dL (ref <30)

## 2015-02-12 LAB — FERRITIN: Ferritin: 61 ng/mL (ref 10–291)

## 2015-02-12 LAB — MAGNESIUM: MAGNESIUM: 2.1 mg/dL (ref 1.5–2.5)

## 2015-02-12 LAB — IRON AND TIBC
%SAT: 28 % (ref 20–55)
IRON: 82 ug/dL (ref 42–145)
TIBC: 288 ug/dL (ref 250–470)
UIBC: 206 ug/dL (ref 125–400)

## 2015-02-12 LAB — VITAMIN D 25 HYDROXY (VIT D DEFICIENCY, FRACTURES): VIT D 25 HYDROXY: 28 ng/mL — AB (ref 30–100)

## 2015-02-12 LAB — INSULIN, FASTING: Insulin fasting, serum: 8.6 u[IU]/mL (ref 2.0–19.6)

## 2015-02-15 ENCOUNTER — Encounter: Payer: Self-pay | Admitting: Physician Assistant

## 2015-03-22 ENCOUNTER — Other Ambulatory Visit: Payer: Self-pay | Admitting: Physician Assistant

## 2015-05-17 ENCOUNTER — Other Ambulatory Visit: Payer: Self-pay | Admitting: Physician Assistant

## 2015-05-17 NOTE — Telephone Encounter (Signed)
Rx called into [facility].

## 2015-08-16 ENCOUNTER — Ambulatory Visit: Payer: Self-pay | Admitting: Physician Assistant

## 2015-08-23 ENCOUNTER — Encounter: Payer: Self-pay | Admitting: Physician Assistant

## 2015-08-23 ENCOUNTER — Ambulatory Visit: Payer: Self-pay | Admitting: Physician Assistant

## 2015-08-23 ENCOUNTER — Other Ambulatory Visit: Payer: Self-pay

## 2015-09-15 ENCOUNTER — Other Ambulatory Visit: Payer: Self-pay | Admitting: Physician Assistant

## 2015-09-16 ENCOUNTER — Other Ambulatory Visit: Payer: Self-pay | Admitting: Physician Assistant

## 2015-09-19 ENCOUNTER — Other Ambulatory Visit: Payer: Self-pay | Admitting: Internal Medicine

## 2015-09-19 ENCOUNTER — Other Ambulatory Visit: Payer: Self-pay | Admitting: Physician Assistant

## 2015-09-19 DIAGNOSIS — F419 Anxiety disorder, unspecified: Secondary | ICD-10-CM

## 2015-09-19 MED ORDER — ALPRAZOLAM 1 MG PO TABS: ORAL_TABLET | ORAL | Status: AC

## 2016-02-15 ENCOUNTER — Encounter: Payer: Self-pay | Admitting: Physician Assistant

## 2017-03-01 ENCOUNTER — Encounter: Payer: Self-pay | Admitting: Physician Assistant
